# Patient Record
Sex: Female | Born: 1970 | Race: Black or African American | Hispanic: No | Marital: Married | Smoking: Never smoker
Health system: Southern US, Community
[De-identification: ages and names within clinical notes are randomized; demographics above are authoritative.]

## PROBLEM LIST (undated history)

## (undated) DIAGNOSIS — I10 Essential (primary) hypertension: Secondary | ICD-10-CM

## (undated) DIAGNOSIS — E049 Nontoxic goiter, unspecified: Secondary | ICD-10-CM

## (undated) HISTORY — PX: ABDOMINAL HYSTERECTOMY: SHX81

---

## 1999-07-17 ENCOUNTER — Encounter: Payer: Self-pay | Admitting: Internal Medicine

## 1999-07-17 ENCOUNTER — Ambulatory Visit (HOSPITAL_COMMUNITY): Admission: RE | Admit: 1999-07-17 | Discharge: 1999-07-17 | Payer: Self-pay | Admitting: Internal Medicine

## 2000-06-16 ENCOUNTER — Encounter: Payer: Self-pay | Admitting: Internal Medicine

## 2000-06-16 ENCOUNTER — Ambulatory Visit (HOSPITAL_COMMUNITY): Admission: RE | Admit: 2000-06-16 | Discharge: 2000-06-16 | Payer: Self-pay | Admitting: Internal Medicine

## 2001-07-03 ENCOUNTER — Inpatient Hospital Stay (HOSPITAL_COMMUNITY): Admission: AD | Admit: 2001-07-03 | Discharge: 2001-07-05 | Payer: Self-pay | Admitting: Obstetrics and Gynecology

## 2001-07-04 ENCOUNTER — Encounter: Payer: Self-pay | Admitting: Obstetrics and Gynecology

## 2001-08-03 ENCOUNTER — Inpatient Hospital Stay (HOSPITAL_COMMUNITY): Admission: AD | Admit: 2001-08-03 | Discharge: 2001-08-06 | Payer: Self-pay | Admitting: Obstetrics and Gynecology

## 2001-08-03 ENCOUNTER — Encounter (INDEPENDENT_AMBULATORY_CARE_PROVIDER_SITE_OTHER): Payer: Self-pay

## 2002-09-27 ENCOUNTER — Other Ambulatory Visit: Admission: RE | Admit: 2002-09-27 | Discharge: 2002-09-27 | Payer: Self-pay | Admitting: Obstetrics and Gynecology

## 2005-01-07 ENCOUNTER — Other Ambulatory Visit: Admission: RE | Admit: 2005-01-07 | Discharge: 2005-01-07 | Payer: Self-pay | Admitting: Obstetrics and Gynecology

## 2005-06-05 ENCOUNTER — Emergency Department (HOSPITAL_COMMUNITY): Admission: EM | Admit: 2005-06-05 | Discharge: 2005-06-06 | Payer: Self-pay | Admitting: Emergency Medicine

## 2006-07-08 ENCOUNTER — Ambulatory Visit (HOSPITAL_COMMUNITY): Admission: RE | Admit: 2006-07-08 | Discharge: 2006-07-08 | Payer: Self-pay | Admitting: Obstetrics and Gynecology

## 2006-12-20 ENCOUNTER — Ambulatory Visit (HOSPITAL_COMMUNITY): Admission: RE | Admit: 2006-12-20 | Discharge: 2006-12-20 | Payer: Self-pay | Admitting: Internal Medicine

## 2007-08-29 ENCOUNTER — Encounter: Admission: RE | Admit: 2007-08-29 | Discharge: 2007-08-29 | Payer: Self-pay | Admitting: Obstetrics and Gynecology

## 2007-10-02 ENCOUNTER — Encounter (INDEPENDENT_AMBULATORY_CARE_PROVIDER_SITE_OTHER): Payer: Self-pay | Admitting: Obstetrics and Gynecology

## 2007-10-02 ENCOUNTER — Inpatient Hospital Stay (HOSPITAL_COMMUNITY): Admission: RE | Admit: 2007-10-02 | Discharge: 2007-10-04 | Payer: Self-pay | Admitting: Obstetrics and Gynecology

## 2007-10-11 ENCOUNTER — Inpatient Hospital Stay (HOSPITAL_COMMUNITY): Admission: AD | Admit: 2007-10-11 | Discharge: 2007-10-11 | Payer: Self-pay | Admitting: Obstetrics and Gynecology

## 2010-01-19 ENCOUNTER — Emergency Department (HOSPITAL_COMMUNITY): Admission: EM | Admit: 2010-01-19 | Discharge: 2010-01-19 | Payer: Self-pay | Admitting: Emergency Medicine

## 2010-09-01 NOTE — Op Note (Signed)
NAMEJONIECE, Angela Crawford              ACCOUNT NO.:  [id number]   MEDICAL RECORD NO.:  [id number]          PATIENT TYPE:  INP   LOCATION:  9302                          FACILITY:  [facility]   PHYSICIAN:  Duke Salvia. Marcelle Overlie, M.D.DATE OF BIRTH:  08-Apr-1971   DATE OF PROCEDURE:  10/02/2007  DATE OF DISCHARGE:                               OPERATIVE REPORT   PREOPERATIVE DIAGNOSES:  Pelvic pain, symptomatic leiomyoma.   POSTOPERATIVE DIAGNOSES:  Pelvic pain, symptomatic leiomyoma plus  extensive pelvic adhesions.   PROCEDURE:  Diagnostic laparoscopy, total abdominal hysterectomy, and  lysis of adhesions.   SURGEON:  Duke Salvia. Marcelle Overlie, MD.   ASSISTANT:  Dineen Kid. Rana Snare, MD.   ANESTHESIA:  General endotracheal.   COMPLICATIONS:  None.   DRAINS:  Foley catheter.   BLOOD LOSS:  150.   SPECIMENS REMOVED:  Uterus.   PROCEDURE AND FINDINGS:  The patient was taken to the operating room  after an adequate level of general endotracheal anesthesia was obtained.  The patient's legs were in stirrups.  The abdomen, perineum, and vagina  were prepped and draped in usual manner for laparoscopy.  Bladder was  drained, EUA carried out, uterus was mid position, 8-10 weeks' size,  irregular.  Tenaculum was positioned.  Foley catheter positioned  draining clear urine.  The subumbilical area was infiltrated with 0.25%  Marcaine plain.  A small incision was made.  The Veress needle was  introduced without difficulty.  Its intra-abdominal position verified by  pressure and water testing.  After 2.5-3 L pneumoperitoneum was then  created, laparoscopic trocar and sleeve were then introduced without  difficulty.  There was no evidence of any bleeding or trauma.  The  patient was then placed in Trendelenburg.  There was extensive dense  adhesions from the anterior uterus completely obscuring the anterior  space.  Decision was made at that point to proceed with open surgery.   The patient's legs were placed  flat.  A Pfannenstiel incision was made  through her old C-section scar and carried down to the fascia which was  incised and extended transversely.  Rectus muscle was divided in the  midline.  Peritoneum entered superiorly without incident and extended  vertically.  There were some omental adhesions into the intra-abdominal  wall which were lysed in avascular plane.  O'Connor-O'Sullivan was  positioned.  The uterus was placed on traction and the dense anterior  adhesions were dissected with sharp and blunt dissection freeing up the  anterior space.  Starting on the left, the round ligaments were  coagulated and divided with LigaSure.  The utero-ovarian pedicle was  clamped, coagulated, and divided on each side conserving both ovaries.  The bladder flap area was dissected below with sharp and blunt  dissection, so that the bladder was well below the cervicovaginal  junction.  The uterine vasculature pedicles on either side was then  skeletonized, clamped, coagulated, and divided.  In sequential manner,  we started to close the uterus.  The uterine vasculature pedicle,  cardinal ligament were coagulated and divided.  The cervicovaginal  pedicles were clamped with curved Heaney clamps and  divided and sutured  in Heaney fashion.  The specimen was excised.  Cuff was closed with a  running 2-0 Monocryl suture.  The pelvis was irrigated with saline and  noted to be hemostatic.  At that point, the bladder was back-filled with  sterile milk.  There was no evidence of any bladder entry.  The pelvis  was re-irrigated and the operative site was noted to be hemostatic.  Prior to closure, sponge, needle, and instrument counts were correct x2.  Peritoneum was closed with running 2-0 Vicryl suture.  Fascia was closed  from laterally to midline on either side with a 0 PDS suture.  Prior to  closure of the fascia, a subfascial and subcutaneous On-Q catheter was  positioned.  Subcutaneous tissue was  irrigated and noted be hemostatic.  Clips and Steri-Strips with a pressure dressing were applied and the On-  Q catheters were primed.  She tolerated this well and went to recovery  room in good condition.      Richard M. Marcelle Overlie, M.D.  Electronically Signed     RMH/MEDQ  D:  10/02/2007  T:  10/02/2007  Job:  [id number]

## 2010-09-01 NOTE — Discharge Summary (Signed)
[name], [name]              ACCOUNT NO.:  [id number]   MEDICAL RECORD NO.:  [id number]          PATIENT TYPE:  INP   LOCATION:  9302                          FACILITY:  [facility]   PHYSICIAN:  Duke Salvia. Marcelle Overlie, M.D.DATE OF BIRTH:  22-Sep-1970   DATE OF ADMISSION:  10/02/2007  DATE OF DISCHARGE:  10/04/2007                               DISCHARGE SUMMARY   DISCHARGE DIAGNOSES:  1. Symptomatic leiomyoma.  2. Pelvic adhesions.  3. Diagnostic laparoscopy followed by total abdominal hysterectomy      this admission.   Summary of the history and physical exam, please see admission H&P for  details.  Briefly, this is a 40 year old G2, P2 patient with 1 previous  cesarean and tubal ligation with 6-12 months' of history of worsening  pelvic pain and documented leiomyoma who presents for hysterectomy.   HOSPITAL COURSE:  On October 02, 2007, under general anesthesia, the  patient underwent LAVH after a laparoscopy, however, there was severe  dense anterior adhesions to the uterus that necessitated laparotomy with  TAH.  Both ovaries were conserved.   PROCEDURE AND FINDINGS:  On the first postoperative day, her catheter  was removed.  She was afebrile.  Her diet was advanced.  The second  postop day, she was ambulating and established normal urinary function.  She was tolerating a regular diet, afebrile.  The On-Q catheters were  removed at that point and she was ready for discharge.   LABORATORY DATA:  UPT on admission was negative.  CMET normal except for  glucose of 102.  CBC on admission, WBC 5.8, hemoglobin 12.2, hematocrit  35.6, blood type O positive.  Antibody screen negative.  CBC on October 03, 2007, WBC 11.3, hemoglobin 9.1, and hematocrit 26.4.   DISPOSITION:  The patient discharged on Tylox p.r.n. pain and will take  multivitamin with iron once daily.  Will to return the office in 4-5  days and have the staples removed.  Advised to report any incisional  redness or drainage,  increase pain or bleeding, or fever over 101.  She  was given specific instructions for regarding, sex, and exercise.   CONDITION:  Good.   ACTIVITIES:  Gradual increase.      Richard M. Marcelle Overlie, M.D.  Electronically Signed     RMH/MEDQ  D:  10/04/2007  T:  10/05/2007  Job:  [id number]

## 2010-09-01 NOTE — H&P (Signed)
Angela Crawford, Angela Crawford              ACCOUNT NO.:  [id number]   MEDICAL RECORD NO.:  [id number]          PATIENT TYPE:  AMB   LOCATION:                                [facility]  [facility]   PHYSICIAN:  Duke Salvia. Marcelle Overlie, M.D.DATE OF BIRTH:  1970-04-27   DATE OF ADMISSION:  10/02/2007  DATE OF DISCHARGE:                              HISTORY & PHYSICAL   CHIEF COMPLAINT:  Symptomatic fibroids.   HISTORY OF PRESENT ILLNESS:  A 40 year old G2, P2 previous tubal.  She  has had one vaginal delivery and cesarean section with tubal at the same  time, has a history of last several years of worsening pelvic pain that  has really increased over the last 6 months.  Part of her evaluation,  she has recently had a ultrasound April 2009, that showed several  fibroids 4.1, 3.6, 3.2 and 1.8 cm.   Approximately a year ago, she had abdominal pelvic CT that did show  small fibroids.  No other abnormalities except for a small liver  hemangioma.  This was evaluated by GI and they also performed  colonoscopy that was normal except for mild colitis that was April 2008.  She presents now for LAVH.  This procedure including risks related to  bleeding, infection, adjacent organ injury, the possible need for open  additional surgery were all reviewed.  Other risks related to wound  infection, phlebitis and her expected recovery time all reviewed which  she understands and accepts.   PAST MEDICAL HISTORY:  Allergies, none.   CURRENT MEDICATIONS:  Lisinopril.   OPERATIVE HISTORY:  She has had one C-section.   FAMILY HISTORY:  Significant for a sister who had a kidney transplant,  mother and grandmother both with hypertension.   PHYSICAL EXAMINATION:  Temperature 93, blood pressure 112/84.  HEENT:  Unremarkable.  NECK:  Supple without masses.  LUNGS:  Clear.  CARDIOVASCULAR:  Regular rate and rhythm without murmurs, rubs or  gallops.  BREASTS:  Without masses.  ABDOMEN:  Soft, flat and nontender.  PELVIC:   Normal external genitalia.  Vagina and cervix clear.  Uterus, 8-  week size, mid position.  Adnexa negative.  EXTREMITIES/NEUROLOGIC:  Unremarkable   IMPRESSION:  Symptomatic leiomyoma.   PLAN:  LAVH procedure and risks reviewed as above.      Richard M. Marcelle Overlie, M.D.  Electronically Signed     RMH/MEDQ  D:  09/27/2007  T:  09/27/2007  Job:  [id number]

## 2010-09-02 ENCOUNTER — Other Ambulatory Visit: Payer: Self-pay | Admitting: Internal Medicine

## 2010-09-02 DIAGNOSIS — R221 Localized swelling, mass and lump, neck: Secondary | ICD-10-CM

## 2010-09-04 NOTE — H&P (Signed)
[facility]  Patient:    Angela Crawford, Angela Crawford Visit Number: [id number] MRN: [id number]          Service Type: OBS Location: 910B 9153 01 Attending Physician:  Rhina Brackett Dictated by:   Duke Salvia. Marcelle Overlie, M.D. Admit Date:  07/03/2001 Discharge Date: 07/05/2001                           History and Physical  CHIEF COMPLAINT:              Breech presentation at term.  HISTORY OF PRESENT ILLNESS:   A 40 year old G2 P1, EDD August 12, 2001 with EGA [redacted] weeks.  Presents for a primary cesarean section for persistent breech presentation.  She declined ECV.  This procedure including risks of bleeding, infection, possible need for transfusion discussed.  The permanence of the tubal procedure, failure rate of 2-06/998 reviewed with her also.  LABORATORY DATA:              Blood type O positive.  Rubella titer positive. One-hour GTT 99.  PAST MEDICAL HISTORY:  ALLERGIES:                    None.  OPERATIONS:                   Vaginal delivery in 1994.  Wisdom teeth extraction.  MEDICAL:                      She has a history of hypothyroidism, is currently on Synthroid replacement.  Rubella titer positive.  Varicella IgG and IgM are negative.  PHYSICAL EXAMINATION:  VITAL SIGNS:                  Temperature 98.2, blood pressure 120/68.  No proteinuria noted on April 14.  LUNGS:                        Clear.  BREASTS:                      Not examined.  ABDOMEN:                      Term fundal height.  Fetal heart rate 140.  CERVIX:                       Closed, breech by Leopolds and also by bedside US.  IMPRESSION:                   1. Term intrauterine pregnancy.                               2. Mild chronic hypertension, stable.                               3. Persistent breech presentation.  PLAN:                         Primary cesarean section and tubal ligation. Procedure and risks discussed as above. Dictated by:   Duke Salvia. Marcelle Overlie, M.D. Attending Physician:  Rhina Brackett DD:  07/31/01 TD:  07/31/01 Job: [id number]

## 2010-09-04 NOTE — Op Note (Signed)
[facility]  Patient:    Angela Crawford, Angela Crawford Visit Number: [id number] MRN: [id number]          Service Type: OBS Location: 9300 [id number] [id number] Attending Physician:  Rhina Brackett Dictated by:   Duke Salvia. Marcelle Overlie, M.D. Proc. Date: 08/03/01 Admit Date:  08/03/2001                             Operative Report  PREOPERATIVE DIAGNOSIS:       Term intrauterine pregnancy, persistent breech                               presentation, declined _____, request permanent                               sterilization.  POSTOPERATIVE DIAGNOSIS:      Term intrauterine pregnancy, persistent breech                               presentation, declined _____, request permanent                               sterilization  OPERATION:                    Primary low transverse cesarean section,                               modified Pomeroy tubal ligation.  SURGEON:                      Duke Salvia. Marcelle Overlie, M.D.  ANESTHESIA:                   Spinal  COMPLICATIONS:                None.  DRAINS:                       Foley catheter.  ESTIMATED BLOOD LOSS:         800.  DESCRIPTION OF PROCEDURE:     The patient was brought to the operating room and after an adequate level of spinal anesthetic was obtained, the patient in the left tilt position, the abdomen prepped and draped in the usual manner for sterile abdominal procedures, a transverse Pfannenstiel incision was made two fingerbreadths above the symphysis, and carried down to the fascia which was incised and extended transversely.  The rectus muscles were divided in the midline.  The peritoneum was entered superiorly without incident and extended in a vertical manner.  The vesicouterine serosa was incised and the bladder was bluntly and sharply dissected off of the lower uterine segment and the bladder blade repositioned.  A transverse incision was made in the lower segment and extended with bandaged scissors.   Clear fluid noted.  The patient was then delivered of a female in the frank breech presentation without difficulty.  Apgar was 9 and 9.  There was a nuchal cord x1 and cord around the body x1.  After delivery of the infant, the placenta was delivered manually intact.  The uterus was exteriorized and the cavity  wiped clean with a laparotomy pack. Closure was obtained in the first layer with 0 chromic in a locked fashion followed by imbricating layer of 0 chromic.  This was hemostatic.  Attention was directed to the tubal.  Starting on the right, the tube was grasped in the mid segment and 0 plain suture tied around each end of a mid segment knuckle of tube which was excised and the cut ends cauterized with the Bovie, and 2-0 silk suture placed around the proximal segment.  The tubes and ovaries were normal on either side.  The tip of the appendix was adherent with a solitary adhesion to what appeared to be a small fibroid at the fundus of the uterus.  This was clamped and an avascular plane and released.  The appendix was not removed.  It was taken off tension.  The uterus was returned to its intra-abdominal position.  Prior to closure, the sponge, needle, and instrument counts were reported as correct x2.  The uterine incision site was hemostatic.  The rectus muscles were reapproximated in the midline with 2-0 Dexon interrupted sutures.  The fascia was closed from laterally to midline on either side with a 0 Dexon running suture.  The subcutaneous fat was hemostatic.  Clips and Steri-Strips were used on the skin.  She tolerated this well and went to the recovery room in good condition. Dictated by:   Duke Salvia. Marcelle Overlie, M.D. Attending Physician:  Rhina Brackett DD:  08/03/01 TD:  08/04/01 Job: [id number]

## 2010-09-04 NOTE — Discharge Summary (Signed)
[facility]  Patient:    Angela Crawford, Angela Crawford Visit Number: [id number] MRN: [id number]          Service Type: OBS Location: 9300 [id number] 01 Attending Physician:  Rhina Brackett Dictated by:   Julio Sicks, N.P. Admit Date:  08/03/2001 Discharge Date: 08/06/2001                             Discharge Summary  ADMITTING DIAGNOSES:          1. Intrauterine pregnancy at term.                               2. Breech presentation, declined external                                  version.                               3. Request permanent sterilization.  DISCHARGE DIAGNOSES:          1. Primary low transverse cesarean delivery with                                  a viable female infant.                               2. Pomeroy tubal ligation.  PROCEDURE:                    Primary low transverse cesarean delivery with modified Pomeroy tubal ligation.  REASON FOR ADMISSION:         Please see dictated H&P.  HOSPITAL COURSE:              The patient was taken to the operating room for the above-named procedure.  A Pfannenstiel incision was made and a viable female infant was delivered without complication weighing 7 pounds 4 ounces with Apgars of 9 at 1 minute and 9 at 5 minutes.  On postoperative day #1, the patient had good return of bowel function.  The fundus was firm, lochia small. Abdominal dressing was clean, dry, and intact.  Foley was removed and the patient was voiding without difficulty.  On postoperative day #2, the abdomen remained soft.  The incision was clean, dry, and intact.  Labs revealed hemoglobin of 8.5, hematocrit of 24.7, and WBC count of 11,000.  On postoperative day #3, the patient was doing well, ambulating without assistance.  Staples were removed and the patient was discharged home.  CONDITION ON DISCHARGE:       Good.  DIET:                         Regular as tolerated.  ACTIVITY:                     No heavy lifting, no  driving x2 weeks, no vaginal entry.  FOLLOWUP:                     The patient is to follow up in the office in 1-2 weeks for  an incision check.  She is to call for a temperature greater than 100 degrees, persistent nausea and vomiting, heavy vaginal bleeding, and/or redness or drainage from the incision site.  DISCHARGE MEDICATIONS:        1. Tylox 1 every 4-6 hours p.r.n. pain.                               2. Motrin 600 mg over-the-counter every six                                  hours p.r.n.                               3. Prenatal vitamins 1 p.o. daily.                               4. Resume Synthroid 0.112 daily. Dictated by:   Julio Sicks, N.P. Attending Physician:  Rhina Brackett DD:  08/24/01 TD:  08/28/01 Job: [id number]

## 2010-09-04 NOTE — H&P (Signed)
[facility]  Patient:    Angela Crawford, Angela Crawford Visit Number: [id number] MRN: [id number]          Service Type: OBS Location: 910B 9153 01 Attending Physician:  Rhina Brackett Dictated by:   Duke Salvia. Marcelle Overlie, M.D. Admit Date:  07/03/2001 Discharge Date: 07/05/2001                           History and Physical  DATE OF SCHEDULED SURGERY:    August 08, 2001  CHIEF COMPLAINT:              Chronic pelvic pain.  HISTORY OF PRESENT ILLNESS:   This 40 year old G0, P0, currently on Ortho Tri-Cyclen and has had a several-year history of midpelvic and right lower quadrant pelvic pain.  This did not improve with conservative therapy, and she [facility] a diagnostic lapar[facility], 1999[facility]  The findings at that time showed that the uterine fundus was deviated sharply to the right.  The right tube and ovary appeared to be normal, but the right broad ligament was extremely foreshortened, pulling the entire uterus to the right.  This did not appear to be related to endometriosis or chronic inflammatory disease, but looked perhaps more congenital.  She has continued to struggle with episodes of pain on the right side that is not improved with nonsteroidal medicines, and has made the decision to proceed with a definite hysterectomy and RSO.  This procedure, including risk of bleeding, infection, transfusion, adjacent organ injury, with the possible need for open surgery to complete the case.  All has been discussed with her.  Also of note, two years ago she had a GI evaluation by Dr. Anselmo Rod, with a negative GI evaluation.  ALLERGIES:                    No known drug allergies.  OPERATIONS:                   Laparoscopy and a spinal fusion at L4-5 in 1988.  CURRENT MEDICATIONS:          OCP.  PAST MEDICAL HISTORY:         She has had one prior SAB.  PHYSICAL EXAMINATION  VITAL SIGNS:                   Temperature 98.2 degrees, blood pressure 120/68.  HEENT:                        Unremarkable.  NECK:                         Supple without masses.  LUNGS:                        Clear.  CARDIOVASCULAR:               Regular rate and rhythm without murmurs, rubs, or gallops.  BREASTS:                      Without masses.  ABDOMEN:                      Soft, flat, nontender.  PELVIC:  Normal external genitalia.  Vagina, cervix clear.  Uterus deviated to the right, slightly tender.  Adnexa without masses or tenderness.  IMPRESSION:                   Chronic pelvic pain, with laparoscopic findings showing a shortened right broad and round ligament.  Uterus strongly deviated to the right side.  PLAN:                         LAVH, RSO.  The procedure and risks were reviewed as above. Dictated by:   Duke Salvia. Marcelle Overlie, M.D. Attending Physician:  Rhina Brackett DD:  07/31/01 TD:  07/31/01 Job: [id number]

## 2010-09-09 ENCOUNTER — Ambulatory Visit (HOSPITAL_COMMUNITY)
Admission: RE | Admit: 2010-09-09 | Discharge: 2010-09-09 | Disposition: A | Discharge status: Home / Self Care | Payer: Medicare HMO | Source: Ambulatory Visit | Attending: Internal Medicine | Admitting: Internal Medicine

## 2010-09-09 DIAGNOSIS — E049 Nontoxic goiter, unspecified: Secondary | ICD-10-CM | POA: Insufficient documentation

## 2010-09-09 DIAGNOSIS — R221 Localized swelling, mass and lump, neck: Secondary | ICD-10-CM

## 2010-10-19 ENCOUNTER — Encounter: Payer: Self-pay | Admitting: *Deleted

## 2011-01-14 LAB — TYPE AND SCREEN
ABO/RH(D): O POS
Antibody Screen: NEGATIVE

## 2011-01-14 LAB — CBC
HCT: 35.6 — ABNORMAL LOW
HCT: 26.4 — ABNORMAL LOW
Hemoglobin: 10.6 — ABNORMAL LOW
MCHC: 34.3
MCV: 84.7
MCV: 85.6
Platelets: 467 — ABNORMAL HIGH
RBC: 4.2
RBC: 3.09 — ABNORMAL LOW
RBC: 3.69 — ABNORMAL LOW
RDW: 15.1
WBC: 5.8
WBC: 10.8 — ABNORMAL HIGH

## 2011-01-14 LAB — COMPREHENSIVE METABOLIC PANEL
ALT: 20
AST: 16
Alkaline Phosphatase: 67
BUN: 8
Chloride: 99
GFR calc Af Amer: >60
Potassium: 3.6
Total Protein: 7.5

## 2011-01-14 LAB — PREGNANCY, URINE: Preg Test, Ur: NEGATIVE

## 2011-01-14 LAB — ABO/RH: ABO/RH(D): O POS

## 2011-12-20 ENCOUNTER — Encounter (HOSPITAL_COMMUNITY): Payer: Self-pay

## 2011-12-20 ENCOUNTER — Emergency Department (HOSPITAL_COMMUNITY)
Admission: EM | Admit: 2011-12-20 | Discharge: 2011-12-20 | Disposition: A | Discharge status: Home / Self Care | Payer: Managed Care, Other (non HMO) | Source: Home / Self Care | Attending: Emergency Medicine | Admitting: Emergency Medicine

## 2011-12-20 DIAGNOSIS — R1032 Left lower quadrant pain: Secondary | ICD-10-CM

## 2011-12-20 DIAGNOSIS — R3129 Other microscopic hematuria: Secondary | ICD-10-CM

## 2011-12-20 DIAGNOSIS — R52 Pain, unspecified: Secondary | ICD-10-CM

## 2011-12-20 HISTORY — DX: Essential (primary) hypertension: I10

## 2011-12-20 LAB — POCT URINALYSIS DIP (DEVICE)
Bilirubin Urine: NEGATIVE
Ketones, ur: NEGATIVE mg/dL
Leukocytes, UA: NEGATIVE
Urobilinogen, UA: 0.2 mg/dL (ref 0.0–1.0)

## 2011-12-20 MED ORDER — HYDROCODONE-ACETAMINOPHEN 7.5-325 MG PO TABS: 1.0000 | ORAL_TABLET | ORAL | Status: AC | PRN

## 2011-12-20 MED ORDER — KETOROLAC TROMETHAMINE 60 MG/2ML IM SOLN: 60.0000 mg | Freq: Once | INTRAMUSCULAR | Status: DC

## 2011-12-20 MED ORDER — KETOROLAC TROMETHAMINE 60 MG/2ML IM SOLN
60.0000 mg | Freq: Once | INTRAMUSCULAR | Status: AC
  Administered 2011-12-20: 60 mg via INTRAMUSCULAR

## 2011-12-20 MED ORDER — KETOROLAC TROMETHAMINE 60 MG/2ML IM SOLN
INTRAMUSCULAR | Status: AC
  Filled 2011-12-20: qty 2

## 2011-12-20 NOTE — ED Provider Notes (Signed)
Medical screening examination/treatment/procedure(s) were performed by non-physician practitioner and as supervising physician I was immediately available for consultation/collaboration.  Luiz Blare MD   Luiz Blare, MD 12/20/11 [phone]

## 2011-12-20 NOTE — ED Notes (Signed)
Provided w urine strainer and  instructed in use

## 2011-12-20 NOTE — ED Provider Notes (Addendum)
History     CSN: [id number]  Arrival date & time 12/20/11  1737   First MD Initiated Contact with Patient 12/20/11 1816      Chief Complaint  Patient presents with  . Abdominal Pain    (Consider location/radiation/quality/duration/timing/severity/associated sxs/prior treatment) HPI Comments: Pain located L lower most quadrant 1 cm superior to the iliac crest and radiating to the L hip.   Patient is a 41 y.o. female presenting with abdominal pain.  Abdominal Pain The primary symptoms of the illness include abdominal pain. The primary symptoms of the illness do not include fever, shortness of breath, nausea, vomiting, diarrhea, hematemesis, dysuria, vaginal discharge or vaginal bleeding. The current episode started more than 2 days ago. The onset of the illness was gradual. The problem has been gradually worsening.  The patient states that she believes she is currently not pregnant. The patient has not had a change in bowel habit. Additional symptoms associated with the illness include frequency and back pain. Symptoms associated with the illness do not include chills, diaphoresis, heartburn or constipation. Significant associated medical issues do not include inflammatory bowel disease, liver disease, diverticulitis or cardiac disease.    Past Medical History  Diagnosis Date  . Hypertension     Past Surgical History  Procedure Date  . Abdominal hysterectomy     History reviewed. No pertinent family history.  History  Substance Use Topics  . Smoking status: Never Smoker   . Smokeless tobacco: Not on file  . Alcohol Use: Yes    OB History    Grav Para Term Preterm Abortions TAB SAB Ect Mult Living                  Review of Systems  Constitutional: Negative for fever, chills and diaphoresis.  Respiratory: Negative for shortness of breath.   Gastrointestinal: Positive for abdominal pain. Negative for heartburn, nausea, vomiting, diarrhea, constipation and hematemesis.    Genitourinary: Positive for frequency. Negative for dysuria, vaginal bleeding and vaginal discharge.  Musculoskeletal: Positive for back pain.    Allergies  Review of patient's allergies indicates no known allergies.  Home Medications   Current Outpatient Rx  Name Route Sig Dispense Refill  . HYDROCODONE-ACETAMINOPHEN 7.5-325 MG PO TABS Oral Take 1 tablet by mouth every 4 (four) hours as needed for pain. 30 tablet 0  . LEVOTHYROXINE SODIUM 88 MCG PO TABS Oral Take 88 mcg by mouth daily.      Marland Kitchen ONE-DAILY MULTI VITAMINS PO TABS Oral Take 1 tablet by mouth daily.      Marland Kitchen OLMESARTAN MEDOXOMIL-HCTZ 40-12.5 MG PO TABS Oral Take 1 tablet by mouth daily.        BP 130/83  Pulse 94  Temp 99.6 F (37.6 C) (Oral)  Resp 19  SpO2 99%  Physical Exam  Nursing note and vitals reviewed. Constitutional: She is oriented to person, place, and time. She appears well-developed and well-nourished. No distress.  Neck: Normal range of motion. Neck supple.  Cardiovascular: Normal rate, regular rhythm and normal heart sounds.   Pulmonary/Chest: Effort normal and breath sounds normal. No respiratory distress.  Abdominal: Soft. She exhibits no mass. There is tenderness. There is no rebound and no guarding.       Hypoactive BS. No lateral abd tenderness. Tenderness 1 cm superior to iliac crest. There is also incidental mild tenderness in RUQ.   Neurological: She is alert and oriented to person, place, and time.  Skin: Skin is warm and dry.  ED Course  Procedures (including critical care time)  Labs Reviewed  POCT URINALYSIS DIP (DEVICE) - Abnormal; Notable for the following:    Hgb urine dipstick MODERATE (*)     All other components within normal limits   No results found.   1. Abdominal pain, acute, left lower quadrant   2. Hematuria, microscopic       MDM  With micro hematuria type of pain as colicy/intermittent and absence of acute abdomen signs I believe the most likely diagnosis is  urteral stone. Doubt diverticulitis at this time.  Treat with Norco for pain, plenty of fluids and to f/u with Dr. Laverle Patter this week. If worse or new sx's in the meantime to go to the ED.   Results for orders placed during the hospital encounter of 12/20/11  POCT URINALYSIS DIP (DEVICE)      Component Value Range   Glucose, UA NEGATIVE  NEGATIVE mg/dL   Bilirubin Urine NEGATIVE  NEGATIVE   Ketones, ur NEGATIVE  NEGATIVE mg/dL   Specific Gravity, Urine 1.015  1.005 - 1.030   Hgb urine dipstick MODERATE (*) NEGATIVE   pH 5.5  5.0 - 8.0   Protein, ur NEGATIVE  NEGATIVE mg/dL   Urobilinogen, UA 0.2  0.0 - 1.0 mg/dL   Nitrite NEGATIVE  NEGATIVE   Leukocytes, UA NEGATIVE  NEGATIVE        Hayden Rasmussen, NP 12/20/11 2055  Hayden Rasmussen, NP 12/20/11 2308

## 2011-12-20 NOTE — ED Notes (Signed)
C/o LLQ abdominal pain w radiation to lower left back/hip since PM 9-29. Had 1 prior episode that lasted only 1 day. Had 2 BM's today , first one loose, 2nd one "normal"; NAD at present, but looks uncomfortable. States when she urinates, she has the sensation she is done, but needs to go again.C/o slight nausea, no vomiting

## 2011-12-22 NOTE — ED Provider Notes (Signed)
Chart reviewed. Agree with Hayden Rasmussen, NP.  Cristal Ford, MD 12/22/2011, 8:42 AM

## 2012-12-13 ENCOUNTER — Emergency Department (HOSPITAL_COMMUNITY)
Admission: EM | Admit: 2012-12-13 | Discharge: 2012-12-13 | Disposition: A | Discharge status: Home / Self Care | Payer: Managed Care, Other (non HMO) | Source: Home / Self Care | Attending: Emergency Medicine | Admitting: Emergency Medicine

## 2012-12-13 ENCOUNTER — Encounter (HOSPITAL_COMMUNITY): Payer: Self-pay | Admitting: Emergency Medicine

## 2012-12-13 DIAGNOSIS — D649 Anemia, unspecified: Secondary | ICD-10-CM

## 2012-12-13 DIAGNOSIS — M79609 Pain in unspecified limb: Secondary | ICD-10-CM

## 2012-12-13 DIAGNOSIS — M79604 Pain in right leg: Secondary | ICD-10-CM

## 2012-12-13 DIAGNOSIS — R600 Localized edema: Secondary | ICD-10-CM

## 2012-12-13 DIAGNOSIS — R609 Edema, unspecified: Secondary | ICD-10-CM

## 2012-12-13 LAB — POCT I-STAT, CHEM 8
Calcium, Ion: 1.39 mmol/L — ABNORMAL HIGH (ref 1.12–1.23)
Chloride: 104 mEq/L (ref 96–112)
Glucose, Bld: 100 mg/dL — ABNORMAL HIGH (ref 70–99)
HCT: 34 % — ABNORMAL LOW (ref 36.0–46.0)
Hemoglobin: 11.6 g/dL — ABNORMAL LOW (ref 12.0–15.0)
TCO2: 26 mmol/L (ref 0–100)

## 2012-12-13 LAB — D-DIMER, QUANTITATIVE: D-Dimer, Quant: 1.45 ug/mL-FEU — ABNORMAL HIGH (ref 0.00–0.48)

## 2012-12-13 MED ORDER — TRAMADOL HCL 50 MG PO TABS: 50.0000 mg | ORAL_TABLET | Freq: Four times a day (QID) | ORAL | Status: DC | PRN

## 2012-12-13 MED ORDER — FUROSEMIDE 40 MG PO TABS: 40.0000 mg | ORAL_TABLET | Freq: Every morning | ORAL | Status: AC
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
## 2012-12-14 ENCOUNTER — Ambulatory Visit (HOSPITAL_COMMUNITY)
Admission: RE | Admit: 2012-12-14 | Discharge: 2012-12-14 | Disposition: A | Discharge status: Home / Self Care | Payer: Managed Care, Other (non HMO) | Source: Ambulatory Visit | Attending: Emergency Medicine | Admitting: Emergency Medicine

## 2012-12-14 ENCOUNTER — Encounter (HOSPITAL_COMMUNITY): Payer: Self-pay | Admitting: *Deleted

## 2012-12-14 DIAGNOSIS — M79609 Pain in unspecified limb: Secondary | ICD-10-CM

## 2012-12-14 DIAGNOSIS — M7989 Other specified soft tissue disorders: Secondary | ICD-10-CM | POA: Insufficient documentation

## 2012-12-14 NOTE — ED Notes (Signed)
D-dimer 1.45 H, BNP 296.1 H.  I can't see any notes. Labs shown to Dr. Denyse Amass.  He said pt. had a vascular study today and Almedia Balls PA has talked to the pt. Vassie Moselle 12/14/2012

## 2012-12-14 NOTE — Progress Notes (Signed)
VASCULAR LAB PRELIMINARY  PRELIMINARY  PRELIMINARY  PRELIMINARY  Bilateral lower extremity venous duplex completed.    Preliminary report:  Bilateral:  No evidence of DVT, superficial thrombosis, or Baker's Cyst.   Desirai Traxler, RVS 12/14/2012, 9:52 AM

## 2012-12-15 NOTE — ED Provider Notes (Signed)
Medical screening examination/treatment/procedure(s) were performed by non-physician practitioner and as supervising physician I was immediately available for consultation/collaboration.  Raynald Blend, MD 12/15/12 [phone]

## 2012-12-27 ENCOUNTER — Other Ambulatory Visit: Payer: Self-pay | Admitting: Internal Medicine

## 2012-12-27 ENCOUNTER — Other Ambulatory Visit (HOSPITAL_COMMUNITY): Payer: Self-pay | Admitting: Internal Medicine

## 2012-12-27 ENCOUNTER — Ambulatory Visit (HOSPITAL_COMMUNITY)
Admission: RE | Admit: 2012-12-27 | Discharge: 2012-12-27 | Disposition: A | Discharge status: Home / Self Care | Payer: Managed Care, Other (non HMO) | Source: Ambulatory Visit | Attending: Internal Medicine | Admitting: Internal Medicine

## 2012-12-27 DIAGNOSIS — R0602 Shortness of breath: Secondary | ICD-10-CM | POA: Insufficient documentation

## 2012-12-27 DIAGNOSIS — R0989 Other specified symptoms and signs involving the circulatory and respiratory systems: Secondary | ICD-10-CM | POA: Insufficient documentation

## 2012-12-27 DIAGNOSIS — R0609 Other forms of dyspnea: Secondary | ICD-10-CM | POA: Insufficient documentation

## 2012-12-27 DIAGNOSIS — R0600 Dyspnea, unspecified: Secondary | ICD-10-CM

## 2012-12-27 DIAGNOSIS — I080 Rheumatic disorders of both mitral and aortic valves: Secondary | ICD-10-CM | POA: Insufficient documentation

## 2012-12-27 DIAGNOSIS — I059 Rheumatic mitral valve disease, unspecified: Secondary | ICD-10-CM

## 2012-12-27 DIAGNOSIS — I079 Rheumatic tricuspid valve disease, unspecified: Secondary | ICD-10-CM | POA: Insufficient documentation

## 2012-12-27 DIAGNOSIS — I10 Essential (primary) hypertension: Secondary | ICD-10-CM | POA: Insufficient documentation

## 2012-12-27 NOTE — Progress Notes (Signed)
Echocardiogram 2D Echocardiogram has been performed.  Angela Crawford 12/27/2012, 3:51 PM

## 2013-02-07 ENCOUNTER — Emergency Department (INDEPENDENT_AMBULATORY_CARE_PROVIDER_SITE_OTHER): Payer: Managed Care, Other (non HMO)

## 2013-02-07 ENCOUNTER — Emergency Department (HOSPITAL_COMMUNITY)
Admission: EM | Admit: 2013-02-07 | Discharge: 2013-02-07 | Disposition: A | Discharge status: Home / Self Care | Payer: Managed Care, Other (non HMO) | Source: Home / Self Care

## 2013-02-07 ENCOUNTER — Encounter (HOSPITAL_COMMUNITY): Payer: Self-pay | Admitting: Emergency Medicine

## 2013-02-07 DIAGNOSIS — R0981 Nasal congestion: Secondary | ICD-10-CM

## 2013-02-07 DIAGNOSIS — J3489 Other specified disorders of nose and nasal sinuses: Secondary | ICD-10-CM

## 2013-02-07 MED ORDER — FEXOFENADINE HCL 180 MG PO TABS: 180.0000 mg | ORAL_TABLET | Freq: Every day | ORAL | Status: AC

## 2013-02-07 MED ORDER — FLUTICASONE PROPIONATE 50 MCG/ACT NA SUSP: 2.0000 | Freq: Two times a day (BID) | NASAL | Status: AC

## 2013-02-07 MED ORDER — METHYLPREDNISOLONE ACETATE 40 MG/ML IJ SUSP
80.0000 mg | Freq: Once | INTRAMUSCULAR | Status: AC
  Administered 2013-02-07: 80 mg via INTRAMUSCULAR

## 2013-02-07 MED ORDER — METHYLPREDNISOLONE ACETATE 80 MG/ML IJ SUSP
INTRAMUSCULAR | Status: AC
  Filled 2013-02-07: qty 1

## 2013-02-07 NOTE — ED Notes (Signed)
Concern for poss sinus infection. C/o pain in left side of face, eye watering, left ear pain, pain worse when bends over

## 2013-02-07 NOTE — ED Provider Notes (Signed)
CSN: [id number]     Arrival date & time 02/07/13  1409 History   None    Chief Complaint  Patient presents with  . Facial Pain   (Consider location/radiation/quality/duration/timing/severity/associated sxs/prior Treatment) Patient is a 42 y.o. female presenting with URI. The history is provided by the patient.  URI Presenting symptoms: congestion, facial pain and rhinorrhea   Presenting symptoms: no fever   Severity:  Moderate Onset quality:  Gradual Duration:  1 week Progression:  Unchanged Chronicity:  New Relieved by:  None tried Associated symptoms: sinus pain     Past Medical History  Diagnosis Date  . Hypertension    Past Surgical History  Procedure Laterality Date  . Abdominal hysterectomy     History reviewed. No pertinent family history. History  Substance Use Topics  . Smoking status: Never Smoker   . Smokeless tobacco: Not on file  . Alcohol Use: Yes   OB History   Grav Para Term Preterm Abortions TAB SAB Ect Mult Living                 Review of Systems  Constitutional: Negative.  Negative for fever.  HENT: Positive for congestion, rhinorrhea and sinus pressure.   Eyes: Positive for discharge.  Respiratory: Negative.   Gastrointestinal: Negative.     Allergies  Review of patient's allergies indicates no known allergies.  Home Medications   Current Outpatient Rx  Name  Route  Sig  Dispense  Refill  . furosemide (LASIX) 40 MG tablet   Oral   Take 1 tablet (40 mg total) by mouth every morning.   30 tablet   0   . levothyroxine (SYNTHROID, LEVOTHROID) 88 MCG tablet   Oral   Take 88 mcg by mouth daily.           . Multiple Vitamin (MULTIVITAMIN) tablet   Oral   Take 1 tablet by mouth daily.           Marland Kitchen olmesartan-hydrochlorothiazide (BENICAR HCT) 40-12.5 MG per tablet   Oral   Take 1 tablet by mouth daily.           . traMADol (ULTRAM) 50 MG tablet   Oral   Take 1 tablet (50 mg total) by mouth every 6 (six) hours as needed for  pain.   30 tablet   0    BP 127/72  Pulse 103  Temp(Src) 98.7 F (37.1 C) (Oral)  Resp 20  SpO2 100% Physical Exam  Nursing note and vitals reviewed. Constitutional: She is oriented to person, place, and time. She appears well-developed and well-nourished.  HENT:  Head: Normocephalic.  Right Ear: External ear normal.  Left Ear: External ear normal.  Nose: Mucosal edema and rhinorrhea present. No sinus tenderness. Right sinus exhibits no maxillary sinus tenderness and no frontal sinus tenderness. Left sinus exhibits maxillary sinus tenderness and frontal sinus tenderness.  Mouth/Throat: Oropharynx is clear and moist.  Eyes: Conjunctivae are normal. Pupils are equal, round, and reactive to light.  Neck: Normal range of motion. Neck supple. Thyromegaly present.  Lymphadenopathy:    She has no cervical adenopathy.  Neurological: She is alert and oriented to person, place, and time.  Skin: Skin is warm and dry.    ED Course  Procedures (including critical care time) Labs Review Labs Reviewed - No data to display Imaging Review No results found.    MDM  X-rays reviewed and report per radiologist.     Linna Hoff, MD 02/07/13 [phone]

## 2013-02-15 ENCOUNTER — Other Ambulatory Visit: Payer: Self-pay | Admitting: Internal Medicine

## 2013-02-15 DIAGNOSIS — E049 Nontoxic goiter, unspecified: Secondary | ICD-10-CM

## 2013-02-15 DIAGNOSIS — R7989 Other specified abnormal findings of blood chemistry: Secondary | ICD-10-CM

## 2013-02-27 ENCOUNTER — Ambulatory Visit (HOSPITAL_COMMUNITY): Payer: Managed Care, Other (non HMO)

## 2013-02-28 ENCOUNTER — Other Ambulatory Visit (HOSPITAL_COMMUNITY): Payer: Managed Care, Other (non HMO)

## 2013-03-01 ENCOUNTER — Encounter (HOSPITAL_COMMUNITY)
Admission: RE | Admit: 2013-03-01 | Discharge: 2013-03-01 | Disposition: A | Discharge status: Home / Self Care | Payer: Managed Care, Other (non HMO) | Source: Ambulatory Visit | Attending: Internal Medicine | Admitting: Internal Medicine

## 2013-03-01 DIAGNOSIS — E049 Nontoxic goiter, unspecified: Secondary | ICD-10-CM | POA: Insufficient documentation

## 2013-03-01 DIAGNOSIS — E0590 Thyrotoxicosis, unspecified without thyrotoxic crisis or storm: Secondary | ICD-10-CM | POA: Insufficient documentation

## 2013-03-01 DIAGNOSIS — R7989 Other specified abnormal findings of blood chemistry: Secondary | ICD-10-CM

## 2013-03-02 ENCOUNTER — Encounter (HOSPITAL_COMMUNITY)
Admission: RE | Admit: 2013-03-02 | Discharge: 2013-03-02 | Disposition: A | Discharge status: Home / Self Care | Payer: Managed Care, Other (non HMO) | Source: Ambulatory Visit | Attending: Internal Medicine | Admitting: Internal Medicine

## 2013-03-02 ENCOUNTER — Encounter (HOSPITAL_COMMUNITY): Payer: Self-pay

## 2013-03-02 HISTORY — DX: Nontoxic goiter, unspecified: E04.9

## 2013-03-02 MED ORDER — SODIUM PERTECHNETATE TC 99M INJECTION
10.2000 | Freq: Once | INTRAVENOUS | Status: AC | PRN
  Administered 2013-03-02: 10.2 via INTRAVENOUS

## 2013-03-02 MED ORDER — SODIUM IODIDE I 131 CAPSULE
12.0000 | Freq: Once | INTRAVENOUS | Status: AC | PRN
  Administered 2013-03-02: 12 via ORAL

## 2013-03-08 ENCOUNTER — Other Ambulatory Visit: Payer: Self-pay | Admitting: Internal Medicine

## 2013-03-08 DIAGNOSIS — E0500 Thyrotoxicosis with diffuse goiter without thyrotoxic crisis or storm: Secondary | ICD-10-CM

## 2013-04-16 ENCOUNTER — Encounter (HOSPITAL_COMMUNITY)
Admission: RE | Admit: 2013-04-16 | Discharge: 2013-04-16 | Disposition: A | Discharge status: Home / Self Care | Payer: Managed Care, Other (non HMO) | Source: Ambulatory Visit | Attending: Internal Medicine | Admitting: Internal Medicine

## 2013-04-16 DIAGNOSIS — E0500 Thyrotoxicosis with diffuse goiter without thyrotoxic crisis or storm: Secondary | ICD-10-CM | POA: Insufficient documentation

## 2013-04-16 LAB — HCG, SERUM, QUALITATIVE: Preg, Serum: NEGATIVE

## 2013-04-16 MED ORDER — SODIUM IODIDE I 131 CAPSULE
11.8800 | Freq: Once | INTRAVENOUS | Status: AC | PRN
  Administered 2013-04-16: 11.88 via ORAL

## 2013-04-25 DIAGNOSIS — J029 Acute pharyngitis, unspecified: Secondary | ICD-10-CM

## 2013-04-25 DIAGNOSIS — J069 Acute upper respiratory infection, unspecified: Secondary | ICD-10-CM

## 2013-04-25 DIAGNOSIS — J45901 Unspecified asthma with (acute) exacerbation: Secondary | ICD-10-CM

## 2014-03-15 ENCOUNTER — Encounter (HOSPITAL_COMMUNITY): Payer: Self-pay | Admitting: *Deleted

## 2014-03-15 ENCOUNTER — Emergency Department (HOSPITAL_COMMUNITY)
Admission: EM | Admit: 2014-03-15 | Discharge: 2014-03-15 | Disposition: A | Discharge status: Home / Self Care | Payer: Managed Care, Other (non HMO) | Source: Home / Self Care | Attending: Family Medicine | Admitting: Family Medicine

## 2014-03-15 DIAGNOSIS — N390 Urinary tract infection, site not specified: Secondary | ICD-10-CM

## 2014-03-15 LAB — POCT URINALYSIS DIP (DEVICE)
BILIRUBIN URINE: NEGATIVE
GLUCOSE, UA: NEGATIVE mg/dL
Ketones, ur: NEGATIVE mg/dL
Leukocytes, UA: NEGATIVE
Nitrite: NEGATIVE
Protein, ur: 30 mg/dL — AB
SPECIFIC GRAVITY, URINE: 1.02 (ref 1.005–1.030)
Urobilinogen, UA: 0.2 mg/dL (ref 0.0–1.0)
pH: 6 (ref 5.0–8.0)

## 2014-03-15 MED ORDER — CEPHALEXIN 500 MG PO CAPS: 500.0000 mg | ORAL_CAPSULE | Freq: Four times a day (QID) | ORAL | Status: DC

## 2014-03-15 MED ORDER — FLUCONAZOLE 150 MG PO TABS
150.0000 mg | ORAL_TABLET | Freq: Once | ORAL | Status: DC
Start: 2014-03-15 — End: 2015-09-09

## 2014-03-15 NOTE — Discharge Instructions (Signed)
Take all of medicine as directed, drink lots of fluids, see your doctor if further problems and for blood pressure recheck and treatment as indicated.

## 2014-03-15 NOTE — ED Provider Notes (Addendum)
CSN: [id number]     Arrival date & time 03/15/14  1317 History   First MD Initiated Contact with Patient 03/15/14 1359     Chief Complaint  Patient presents with  . Urinary Tract Infection   (Consider location/radiation/quality/duration/timing/severity/associated sxs/prior Treatment) Patient is a 43 y.o. female presenting with urinary tract infection. The history is provided by the patient.  Urinary Tract Infection This is a new problem. The current episode started more than 1 week ago (almost 2 wks of sx.). The problem has been gradually worsening. Pertinent negatives include no abdominal pain.    Past Medical History  Diagnosis Date  . Hypertension   . Goiter    Past Surgical History  Procedure Laterality Date  . Abdominal hysterectomy     History reviewed. No pertinent family history. History  Substance Use Topics  . Smoking status: Never Smoker   . Smokeless tobacco: Not on file  . Alcohol Use: Yes   OB History    No data available     Review of Systems  Constitutional: Negative.   Gastrointestinal: Negative.  Negative for abdominal pain.  Genitourinary: Positive for dysuria, urgency and frequency. Negative for vaginal discharge and difficulty urinating.    Allergies  Review of patient's allergies indicates no known allergies.  Home Medications   Prior to Admission medications   Medication Sig Start Date End Date Taking? Authorizing Provider  cephALEXin (KEFLEX) 500 MG capsule Take 1 capsule (500 mg total) by mouth 4 (four) times daily. Take all of medicine and drink lots of fluids 03/15/14   Linna HoffJames D Bridgid Printz, MD  fexofenadine (ALLEGRA) 180 MG tablet Take 1 tablet (180 mg total) by mouth daily. 02/07/13   Linna HoffJames D Monna Crean, MD  fluconazole (DIFLUCAN) 150 MG tablet Take 1 tablet (150 mg total) by mouth once. Repeat in 1 week if needed 03/15/14   Linna HoffJames D Lucette Kratz, MD  fluticasone [facility](FLONASE) 50 MCG/ACT nasal spray Place 2 sprays into the nose 2 (two) times daily. 02/07/13    Linna HoffJames D Lamonica Trueba, MD  furosemide (LASIX) 40 MG tablet Take 1 tablet (40 mg total) by mouth every morning. 12/13/12   Graylon GoodZachary H Baker, PA-C  levothyroxine (SYNTHROID, LEVOTHROID) 88 MCG tablet Take 88 mcg by mouth daily.      Historical Provider, MD  Multiple Vitamin (MULTIVITAMIN) tablet Take 1 tablet by mouth daily.      Historical Provider, MD  olmesartan-hydrochlorothiazide (BENICAR HCT) 40-12.5 MG per tablet Take 1 tablet by mouth daily.      Historical Provider, MD  traMADol (ULTRAM) 50 MG tablet Take 1 tablet (50 mg total) by mouth every 6 (six) hours as needed for pain. 12/13/12   Adrian BlackwaterZachary H Baker, PA-C   BP 160/110 mmHg  Pulse 72  Temp(Src) 98.6 F (37 C) (Oral)  Resp 16  SpO2 100% Physical Exam  Constitutional: She is oriented to person, place, and time. She appears well-developed and well-nourished.  Abdominal: Soft. Bowel sounds are normal. She exhibits no distension and no mass. There is no tenderness. There is no rebound and no guarding.  Neurological: She is alert and oriented to person, place, and time.  Skin: Skin is warm and dry.  Nursing note and vitals reviewed.   ED Course  Procedures (including critical care time) Labs Review Labs Reviewed  POCT URINALYSIS DIP (DEVICE) - Abnormal; Notable for the following:    Hgb urine dipstick MODERATE (*)    Protein, ur 30 (*)    All other components within normal limits  Imaging Review No results found.   MDM   1. UTI (lower urinary tract infection)        Linna HoffJames D Gaetano Romberger, MD 03/15/14 1451  Linna HoffJames D Jillien Yakel, MD 03/15/14 [phone]

## 2014-04-25 ENCOUNTER — Emergency Department (HOSPITAL_COMMUNITY)
Admission: EM | Admit: 2014-04-25 | Discharge: 2014-04-25 | Disposition: A | Discharge status: Home / Self Care | Payer: Managed Care, Other (non HMO) | Source: Home / Self Care | Attending: Family Medicine | Admitting: Family Medicine

## 2014-04-25 ENCOUNTER — Encounter (HOSPITAL_COMMUNITY): Payer: Self-pay

## 2014-04-25 DIAGNOSIS — J4531 Mild persistent asthma with (acute) exacerbation: Secondary | ICD-10-CM

## 2014-04-25 DIAGNOSIS — J069 Acute upper respiratory infection, unspecified: Secondary | ICD-10-CM

## 2014-04-25 DIAGNOSIS — J029 Acute pharyngitis, unspecified: Secondary | ICD-10-CM

## 2014-04-25 LAB — POCT RAPID STREP A: Streptococcus, Group A Screen (Direct): NEGATIVE

## 2014-04-25 MED ORDER — PREDNISONE 20 MG PO TABS
60.0000 mg | ORAL_TABLET | Freq: Once | ORAL | Status: AC
  Administered 2014-04-25: 60 mg via ORAL

## 2014-04-25 MED ORDER — IPRATROPIUM-ALBUTEROL 0.5-2.5 (3) MG/3ML IN SOLN
RESPIRATORY_TRACT | Status: AC
  Filled 2014-04-25: qty 3

## 2014-04-25 MED ORDER — ALBUTEROL SULFATE (2.5 MG/3ML) 0.083% IN NEBU
2.5000 mg | INHALATION_SOLUTION | Freq: Once | RESPIRATORY_TRACT | Status: AC
  Administered 2014-04-25: 2.5 mg via RESPIRATORY_TRACT

## 2014-04-25 MED ORDER — PREDNISONE 20 MG PO TABS
ORAL_TABLET | ORAL | Status: AC
Start: 2014-04-25 — End: 2014-04-25
  Filled 2014-04-25: qty 3

## 2014-04-25 MED ORDER — ALBUTEROL SULFATE HFA 108 (90 BASE) MCG/ACT IN AERS: 2.0000 | INHALATION_SPRAY | RESPIRATORY_TRACT | Status: AC | PRN

## 2014-04-25 MED ORDER — PREDNISONE 20 MG PO TABS: ORAL_TABLET | ORAL | Status: DC

## 2014-04-25 MED ORDER — IPRATROPIUM-ALBUTEROL 0.5-2.5 (3) MG/3ML IN SOLN
3.0000 mL | Freq: Once | RESPIRATORY_TRACT | Status: AC
  Administered 2014-04-25: 3 mL via RESPIRATORY_TRACT

## 2014-04-25 NOTE — Discharge Instructions (Signed)
Bronchospasm A bronchospasm is a spasm or tightening of the airways going into the lungs. During a bronchospasm breathing becomes more difficult because the airways get smaller. When this happens there can be coughing, a whistling sound when breathing (wheezing), and difficulty breathing. Bronchospasm is often associated with asthma, but not all patients who experience a bronchospasm have asthma. CAUSES  A bronchospasm is caused by inflammation or irritation of the airways. The inflammation or irritation may be triggered by:   Allergies (such as to animals, pollen, food, or mold). Allergens that cause bronchospasm may cause wheezing immediately after exposure or many hours later.   Infection. Viral infections are believed to be the most common cause of bronchospasm.   Exercise.   Irritants (such as pollution, cigarette smoke, strong odors, aerosol sprays, and paint fumes).   Weather changes. Winds increase molds and pollens in the air. Rain refreshes the air by washing irritants out. Cold air may cause inflammation.   Stress and emotional upset.  SIGNS AND SYMPTOMS   Wheezing.   Excessive nighttime coughing.   Frequent or severe coughing with a simple cold.   Chest tightness.   Shortness of breath.  DIAGNOSIS  Bronchospasm is usually diagnosed through a history and physical exam. Tests, such as chest X-rays, are sometimes done to look for other conditions. TREATMENT   Inhaled medicines can be given to open up your airways and help you breathe. The medicines can be given using either an inhaler or a nebulizer machine.  Corticosteroid medicines may be given for severe bronchospasm, usually when it is associated with asthma. HOME CARE INSTRUCTIONS   Always have a plan prepared for seeking medical care. Know when to call your health care provider and local emergency services (911 in the [place] Know where you can access local emergency care.  Only take medicines as  directed by your health care provider.  If you were prescribed an inhaler or nebulizer machine, ask your health care provider to explain how to use it correctly. Always use a spacer with your inhaler if you were given one.  It is necessary to remain calm during an attack. Try to relax and breathe more slowly.  Control your home environment in the following ways:   Change your heating and air conditioning filter at least once a month.   Limit your use of fireplaces and wood stoves.  Do not smoke and do not allow smoking in your home.   Avoid exposure to perfumes and fragrances.   Get rid of pests (such as roaches and mice) and their droppings.   Throw away plants if you see mold on them.   Keep your house clean and dust free.   Replace carpet with wood, tile, or vinyl flooring. Carpet can trap dander and dust.   Use allergy-proof pillows, mattress covers, and box spring covers.   Wash bed sheets and blankets every week in hot water and dry them in a dryer.   Use blankets that are made of polyester or cotton.   Wash hands frequently. SEEK MEDICAL CARE IF:   You have muscle aches.   You have chest pain.   The sputum changes from clear or white to yellow, green, gray, or bloody.   The sputum you cough up gets thicker.   There are problems that may be related to the medicine you are given, such as a rash, itching, swelling, or trouble breathing.  SEEK IMMEDIATE MEDICAL CARE IF:   You have worsening wheezing and coughing even  after taking your prescribed medicines.   You have increased difficulty breathing.   You develop severe chest pain. MAKE SURE YOU:   Understand these instructions.  Will watch your condition.  Will get help right away if you are not doing well or get worse. Document Released: 04/08/2003 Document Revised: 04/10/2013 Document Reviewed: 09/25/2012 [facility] Patient Information 2015 [place], [place]. This information is not  intended to replace advice given to you by your health care provider. Make sure you discuss any questions you have with your health care provider.  How to Use an Inhaler Using your inhaler correctly is very important. Good technique will make sure that the medicine reaches your lungs.  HOW TO USE AN INHALER:  Take the cap off the inhaler.  If this is the first time using your inhaler, you need to prime it. Shake the inhaler for 5 seconds. Release four puffs into the air, away from your face. Ask your doctor for help if you have questions.  Shake the inhaler for 5 seconds.  Turn the inhaler so the bottle is above the mouthpiece.  Put your pointer finger on top of the bottle. Your thumb holds the bottom of the inhaler.  Open your mouth.  Either hold the inhaler away from your mouth (the width of 2 fingers) or place your lips tightly around the mouthpiece. Ask your doctor which way to use your inhaler.  Breathe out as much air as possible.  Breathe in and push down on the bottle 1 time to release the medicine. You will feel the medicine go in your mouth and throat.  Continue to take a deep breath in very slowly. Try to fill your lungs.  After you have breathed in completely, hold your breath for 10 seconds. This will help the medicine to settle in your lungs. If you cannot hold your breath for 10 seconds, hold it for as long as you can before you breathe out.  Breathe out slowly, through pursed lips. Whistling is an example of pursed lips.  If your doctor has told you to take more than 1 puff, wait at least 15-30 seconds between puffs. This will help you get the best results from your medicine. Do not use the inhaler more than your doctor tells you to.  Put the cap back on the inhaler.  Follow the directions from your doctor or from the inhaler package about cleaning the inhaler. If you use more than one inhaler, ask your doctor which inhalers to use and what order to use them in. Ask  your doctor to help you figure out when you will need to refill your inhaler.  If you use a steroid inhaler, always rinse your mouth with water after your last puff, gargle and spit out the water. Do not swallow the water. GET HELP IF:  The inhaler medicine only partially helps to stop wheezing or shortness of breath.  You are having trouble using your inhaler.  You have some increase in thick spit (phlegm). GET HELP RIGHT AWAY IF:  The inhaler medicine does not help your wheezing or shortness of breath or you have tightness in your chest.  You have dizziness, headaches, or fast heart rate.  You have chills, fever, or night sweats.  You have a large increase of thick spit, or your thick spit is bloody. MAKE SURE YOU:   Understand these instructions.  Will watch your condition.  Will get help right away if you are not doing well or get worse. Document  Released: 01/13/2008 Document Revised: 01/24/2013 Document Reviewed: 11/02/2012 [facility] Patient Information 2015 [place], [place]. This information is not intended to replace advice given to you by your health care provider. Make sure you discuss any questions you have with your health care provider.  Pharyngitis Pharyngitis is a sore throat (pharynx). There is redness, pain, and swelling of your throat. HOME CARE   Drink enough fluids to keep your pee (urine) clear or pale yellow.  Only take medicine as told by your doctor.  You may get sick again if you do not take medicine as told. Finish your medicines, even if you start to feel better.  Do not take aspirin.  Rest.  Rinse your mouth (gargle) with salt water ( tsp of salt per 1 qt of water) every 1-2 hours. This will help the pain.  If you are not at risk for choking, you can suck on hard candy or sore throat lozenges. GET HELP IF:  You have large, tender lumps on your neck.  You have a rash.  You cough up green, yellow-brown, or bloody spit. GET HELP RIGHT AWAY  IF:   You have a stiff neck.  You drool or cannot swallow liquids.  You throw up (vomit) or are not able to keep medicine or liquids down.  You have very bad pain that does not go away with medicine.  You have problems breathing (not from a stuffy nose). MAKE SURE YOU:   Understand these instructions.  Will watch your condition.  Will get help right away if you are not doing well or get worse. Document Released: 09/22/2007 Document Revised: 01/24/2013 Document Reviewed: 12/11/2012 [facility] Patient Information 2015 [place], [place]. This information is not intended to replace advice given to you by your health care provider. Make sure you discuss any questions you have with your health care provider.  Upper Respiratory Infection, Adult OTC meds as discussed for cold and congestion symptoms An upper respiratory infection (URI) is also sometimes known as the common cold. The upper respiratory tract includes the nose, sinuses, throat, trachea, and bronchi. Bronchi are the airways leading to the lungs. Most people improve within 1 week, but symptoms can last up to 2 weeks. A residual cough may last even longer.  CAUSES Many different viruses can infect the tissues lining the upper respiratory tract. The tissues become irritated and inflamed and often become very moist. Mucus production is also common. A cold is contagious. You can easily spread the virus to others by oral contact. This includes kissing, sharing a glass, coughing, or sneezing. Touching your mouth or nose and then touching a surface, which is then touched by another person, can also spread the virus. SYMPTOMS  Symptoms typically develop 1 to 3 days after you come in contact with a cold virus. Symptoms vary from person to person. They may include:  Runny nose.  Sneezing.  Nasal congestion.  Sinus irritation.  Sore throat.  Loss of voice (laryngitis).  Cough.  Fatigue.  Muscle aches.  Loss of  appetite.  Headache.  Low-grade fever. DIAGNOSIS  You might diagnose your own cold based on familiar symptoms, since most people get a cold 2 to 3 times a year. Your caregiver can confirm this based on your exam. Most importantly, your caregiver can check that your symptoms are not due to another disease such as strep throat, sinusitis, pneumonia, asthma, or epiglottitis. Blood tests, throat tests, and X-rays are not necessary to diagnose a common cold, but they may sometimes be helpful  in excluding other more serious diseases. Your caregiver will decide if any further tests are required. RISKS AND COMPLICATIONS  You may be at risk for a more severe case of the common cold if you smoke cigarettes, have chronic heart disease (such as heart failure) or lung disease (such as asthma), or if you have a weakened immune system. The very young and very old are also at risk for more serious infections. Bacterial sinusitis, middle ear infections, and bacterial pneumonia can complicate the common cold. The common cold can worsen asthma and chronic obstructive pulmonary disease (COPD). Sometimes, these complications can require emergency medical care and may be life-threatening. PREVENTION  The best way to protect against getting a cold is to practice good hygiene. Avoid oral or hand contact with people with cold symptoms. Wash your hands often if contact occurs. There is no clear evidence that vitamin C, vitamin E, echinacea, or exercise reduces the chance of developing a cold. However, it is always recommended to get plenty of rest and practice good nutrition. TREATMENT  Treatment is directed at relieving symptoms. There is no cure. Antibiotics are not effective, because the infection is caused by a virus, not by bacteria. Treatment may include:  Increased fluid intake. Sports drinks offer valuable electrolytes, sugars, and fluids.  Breathing heated mist or steam (vaporizer or shower).  Eating chicken soup  or other clear broths, and maintaining good nutrition.  Getting plenty of rest.  Using gargles or lozenges for comfort.  Controlling fevers with ibuprofen or acetaminophen as directed by your caregiver.  Increasing usage of your inhaler if you have asthma. Zinc gel and zinc lozenges, taken in the first 24 hours of the common cold, can shorten the duration and lessen the severity of symptoms. Pain medicines may help with fever, muscle aches, and throat pain. A variety of non-prescription medicines are available to treat congestion and runny nose. Your caregiver can make recommendations and may suggest nasal or lung inhalers for other symptoms.  HOME CARE INSTRUCTIONS   Only take over-the-counter or prescription medicines for pain, discomfort, or fever as directed by your caregiver.  Use a warm mist humidifier or inhale steam from a shower to increase air moisture. This may keep secretions moist and make it easier to breathe.  Drink enough water and fluids to keep your urine clear or pale yellow.  Rest as needed.  Return to work when your temperature has returned to normal or as your caregiver advises. You may need to stay home longer to avoid infecting others. You can also use a face mask and careful hand washing to prevent spread of the virus. SEEK MEDICAL CARE IF:   After the first few days, you feel you are getting worse rather than better.  You need your caregiver's advice about medicines to control symptoms.  You develop chills, worsening shortness of breath, or brown or red sputum. These may be signs of pneumonia.  You develop yellow or brown nasal discharge or pain in the face, especially when you bend forward. These may be signs of sinusitis.  You develop a fever, swollen neck glands, pain with swallowing, or white areas in the back of your throat. These may be signs of strep throat. SEEK IMMEDIATE MEDICAL CARE IF:   You have a fever.  You develop severe or persistent  headache, ear pain, sinus pain, or chest pain.  You develop wheezing, a prolonged cough, cough up blood, or have a change in your usual mucus (if you have chronic  lung disease).  You develop sore muscles or a stiff neck. Document Released: 09/29/2000 Document Revised: 06/28/2011 Document Reviewed: 07/11/2013 [facility] Patient Information 2015 [place], [place]. This information is not intended to replace advice given to you by your health care provider. Make sure you discuss any questions you have with your health care provider.

## 2014-04-25 NOTE — ED Notes (Signed)
Patient complains of sore throat congestion and cough that started about One week ago.  Patient states she is coughing up thick yellow mucous Patient was starting to feel better but has gotten progressively worse

## 2014-04-25 NOTE — ED Provider Notes (Signed)
CSN: [id number]     Arrival date & time 04/25/14  1729 History   First MD Initiated Contact with Patient 04/25/14 1800     Chief Complaint  Patient presents with  . Sore Throat   (Consider location/radiation/quality/duration/timing/severity/associated sxs/prior Treatment) HPI Comments: 44 year old female complaining of a cough, chest congestion, head congestion, nasal congestion and runny nose with minor sore throat. Denies shortness of breath. She had fever a few days ago but none in the past 2 days. Symptoms began approximately one week ago.   Past Medical History  Diagnosis Date  . Hypertension   . Goiter    Past Surgical History  Procedure Laterality Date  . Abdominal hysterectomy     No family history on file. History  Substance Use Topics  . Smoking status: Never Smoker   . Smokeless tobacco: Not on file  . Alcohol Use: Yes   OB History    No data available     Review of Systems  Constitutional: Positive for fever, activity change and appetite change. Negative for chills and fatigue.  HENT: Positive for congestion, postnasal drip and rhinorrhea. Negative for ear pain and facial swelling.   Eyes: Negative.   Respiratory: Positive for cough. Negative for shortness of breath, wheezing and stridor.   Cardiovascular: Negative.   Gastrointestinal: Negative.   Musculoskeletal: Negative for neck pain and neck stiffness.  Skin: Negative for pallor and rash.  Neurological: Negative.     Allergies  Review of patient's allergies indicates no known allergies.  Home Medications   Prior to Admission medications   Medication Sig Start Date End Date Taking? Authorizing Provider  albuterol (PROVENTIL HFA;VENTOLIN HFA) 108 (90 BASE) MCG/ACT inhaler Inhale 2 puffs into the lungs every 4 (four) hours as needed for wheezing or shortness of breath. 04/25/14   Hayden Rasmussenavid Krislyn Donnan, NP  cephALEXin (KEFLEX) 500 MG capsule Take 1 capsule (500 mg total) by mouth 4 (four) times daily. Take all of  medicine and drink lots of fluids 03/15/14   Linna HoffJames D Kindl, MD  fexofenadine (ALLEGRA) 180 MG tablet Take 1 tablet (180 mg total) by mouth daily. 02/07/13   Linna HoffJames D Kindl, MD  fluconazole (DIFLUCAN) 150 MG tablet Take 1 tablet (150 mg total) by mouth once. Repeat in 1 week if needed 03/15/14   Linna HoffJames D Kindl, MD  fluticasone [facility](FLONASE) 50 MCG/ACT nasal spray Place 2 sprays into the nose 2 (two) times daily. 02/07/13   Linna HoffJames D Kindl, MD  furosemide (LASIX) 40 MG tablet Take 1 tablet (40 mg total) by mouth every morning. 12/13/12   Graylon GoodZachary H Baker, PA-C  levothyroxine (SYNTHROID, LEVOTHROID) 88 MCG tablet Take 88 mcg by mouth daily.      Historical Provider, MD  Multiple Vitamin (MULTIVITAMIN) tablet Take 1 tablet by mouth daily.      Historical Provider, MD  olmesartan-hydrochlorothiazide (BENICAR HCT) 40-12.5 MG per tablet Take 1 tablet by mouth daily.      Historical Provider, MD  predniSONE (DELTASONE) 20 MG tablet Take 3 tabs po on first day, 2 tabs second day, 2 tabs third day, 1 tab fourth day, 1 tab 5th day. Take with food. Start 04/26/14. 04/25/14   Hayden Rasmussenavid Jep Dyas, NP  traMADol (ULTRAM) 50 MG tablet Take 1 tablet (50 mg total) by mouth every 6 (six) hours as needed for pain. 12/13/12   Adrian BlackwaterZachary H Baker, PA-C   BP 172/112 mmHg  Pulse 93  Temp(Src) 98.8 F (37.1 C) (Oral)  Resp 20  SpO2 95% Physical Exam  Constitutional: She is oriented to person, place, and time. She appears well-developed and well-nourished. No distress.  HENT:  Mouth/Throat: No oropharyngeal exudate.  Bilateral TMs are normal Oropharynx with minor erythema and clear PND.  Eyes: Conjunctivae and EOM are normal.  Neck: Normal range of motion. Neck supple.  Cardiovascular: Normal rate, regular rhythm and normal heart sounds.   Pulmonary/Chest: Effort normal. No respiratory distress.  Diminished air movement. Cough reveals wheezing.  Musculoskeletal: Normal range of motion. She exhibits no edema.  Lymphadenopathy:    She has no  cervical adenopathy.  Neurological: She is alert and oriented to person, place, and time.  Skin: Skin is warm and dry. No rash noted.  Psychiatric: She has a normal mood and affect.  Nursing note and vitals reviewed.   ED Course  Procedures (including critical care time) Labs Review Labs Reviewed  POCT RAPID STREP A ([facility] ONLY)   Results for orders placed or performed during the hospital encounter of 04/25/14  POCT rapid strep A [facility])  Result Value Ref Range   Streptococcus, Group A Screen (Direct) NEGATIVE NEGATIVE    Imaging Review No results found.   MDM   1. URI (upper respiratory infection)   2. RAD (reactive airway disease) with wheezing, mild persistent, with acute exacerbation   3. Pharyngitis    Mild improvement post Duoneb 5 mg, increased air movement. Albuterol HFA Prednisone Prednisone 60 mg PO now.  For fevers, increase cough, shortness of breath despite medications go to the ED. Red flags discussed with pt.    \  Hayden Rasmussen, NP 04/25/14 [phone]

## 2014-04-27 LAB — CULTURE, GROUP A STREP

## 2014-09-20 ENCOUNTER — Other Ambulatory Visit: Payer: Self-pay | Admitting: Obstetrics and Gynecology

## 2014-09-23 LAB — CYTOLOGY - PAP

## 2015-07-31 IMAGING — CR DG CHEST 2V
2 series · 2 of 2 positions shown · non-contrast
Comparison: None.

CLINICAL DATA: Dyspnea for 3 weeks, hypertension, nonsmoker

EXAM:
CHEST  2 VIEW

[w chest pa]
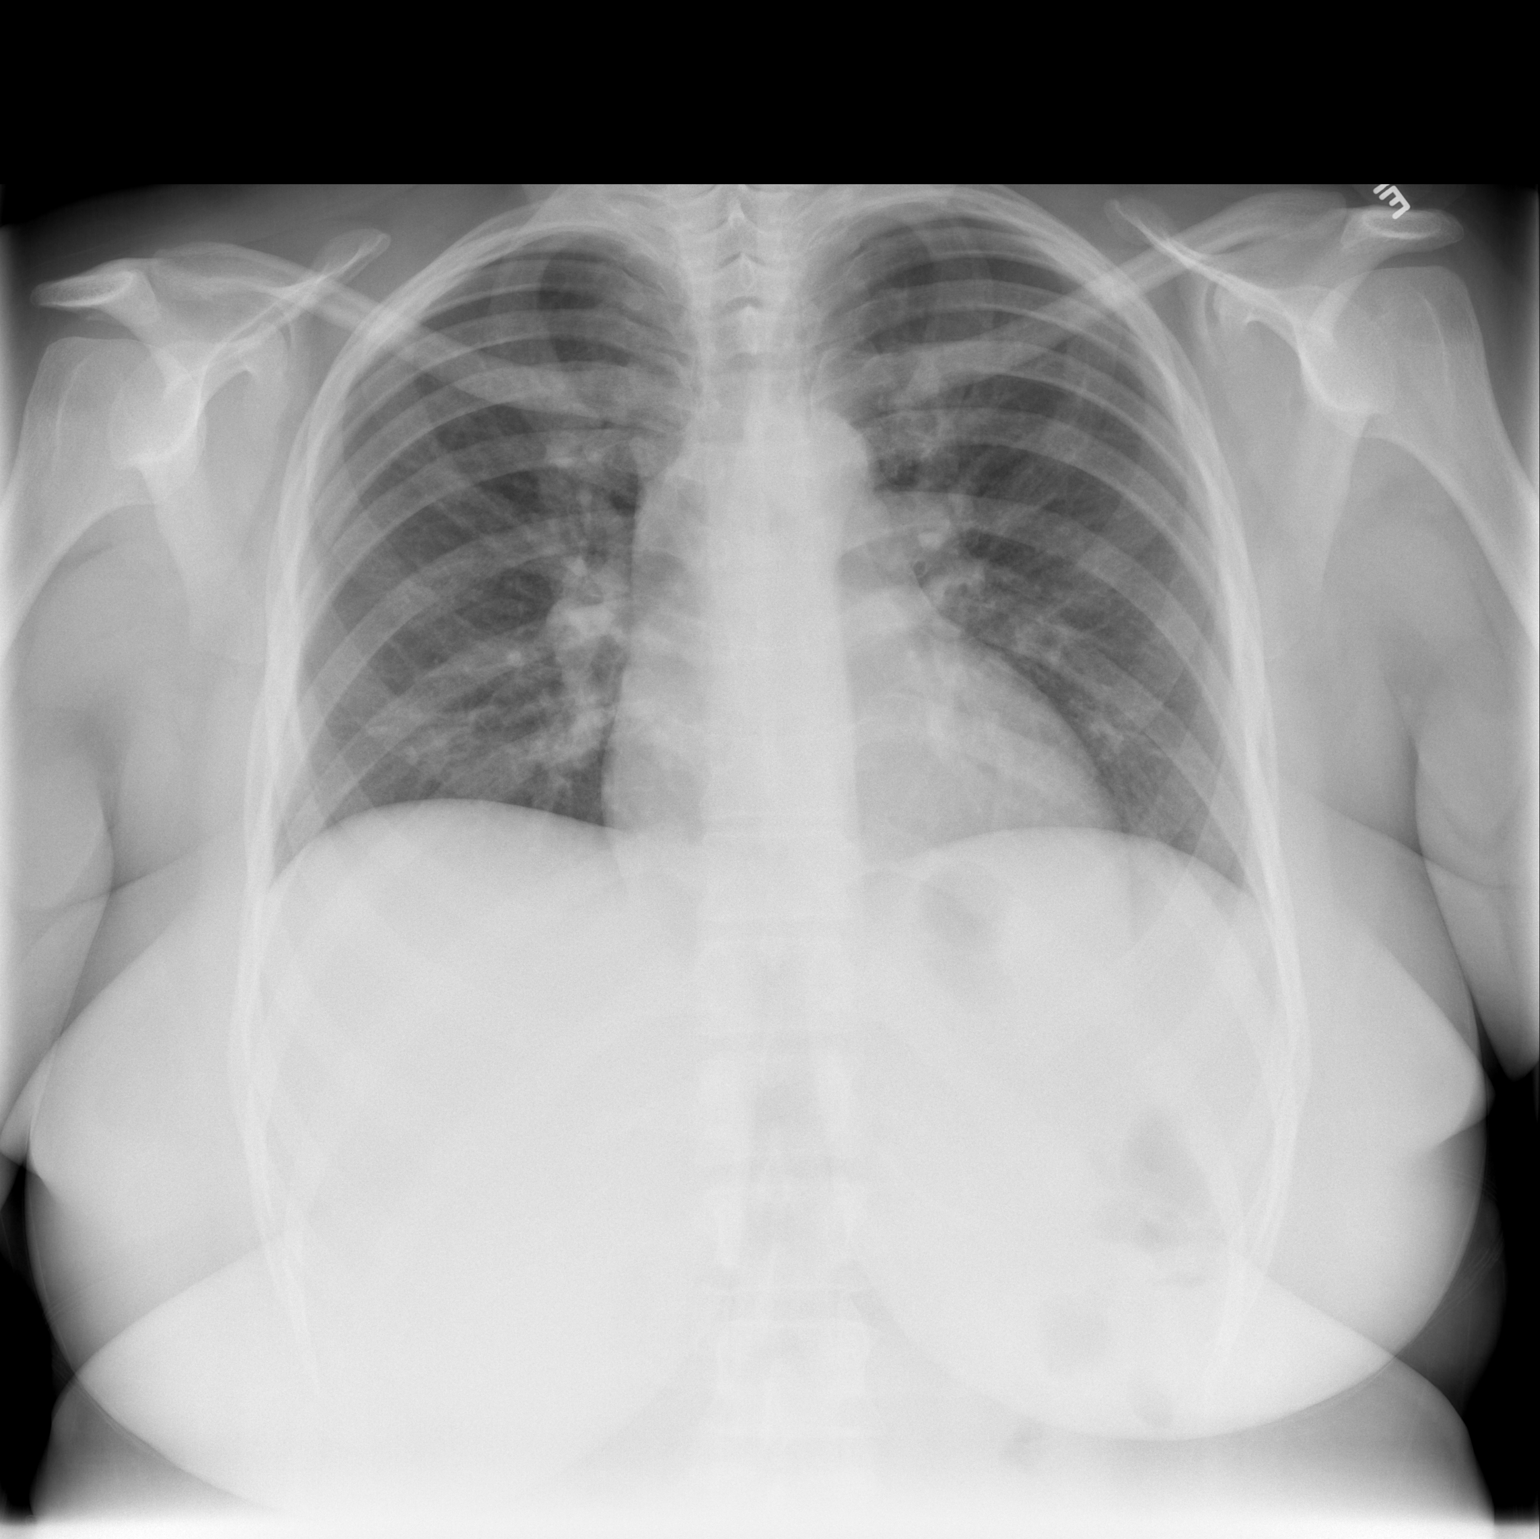

[w chest lat]
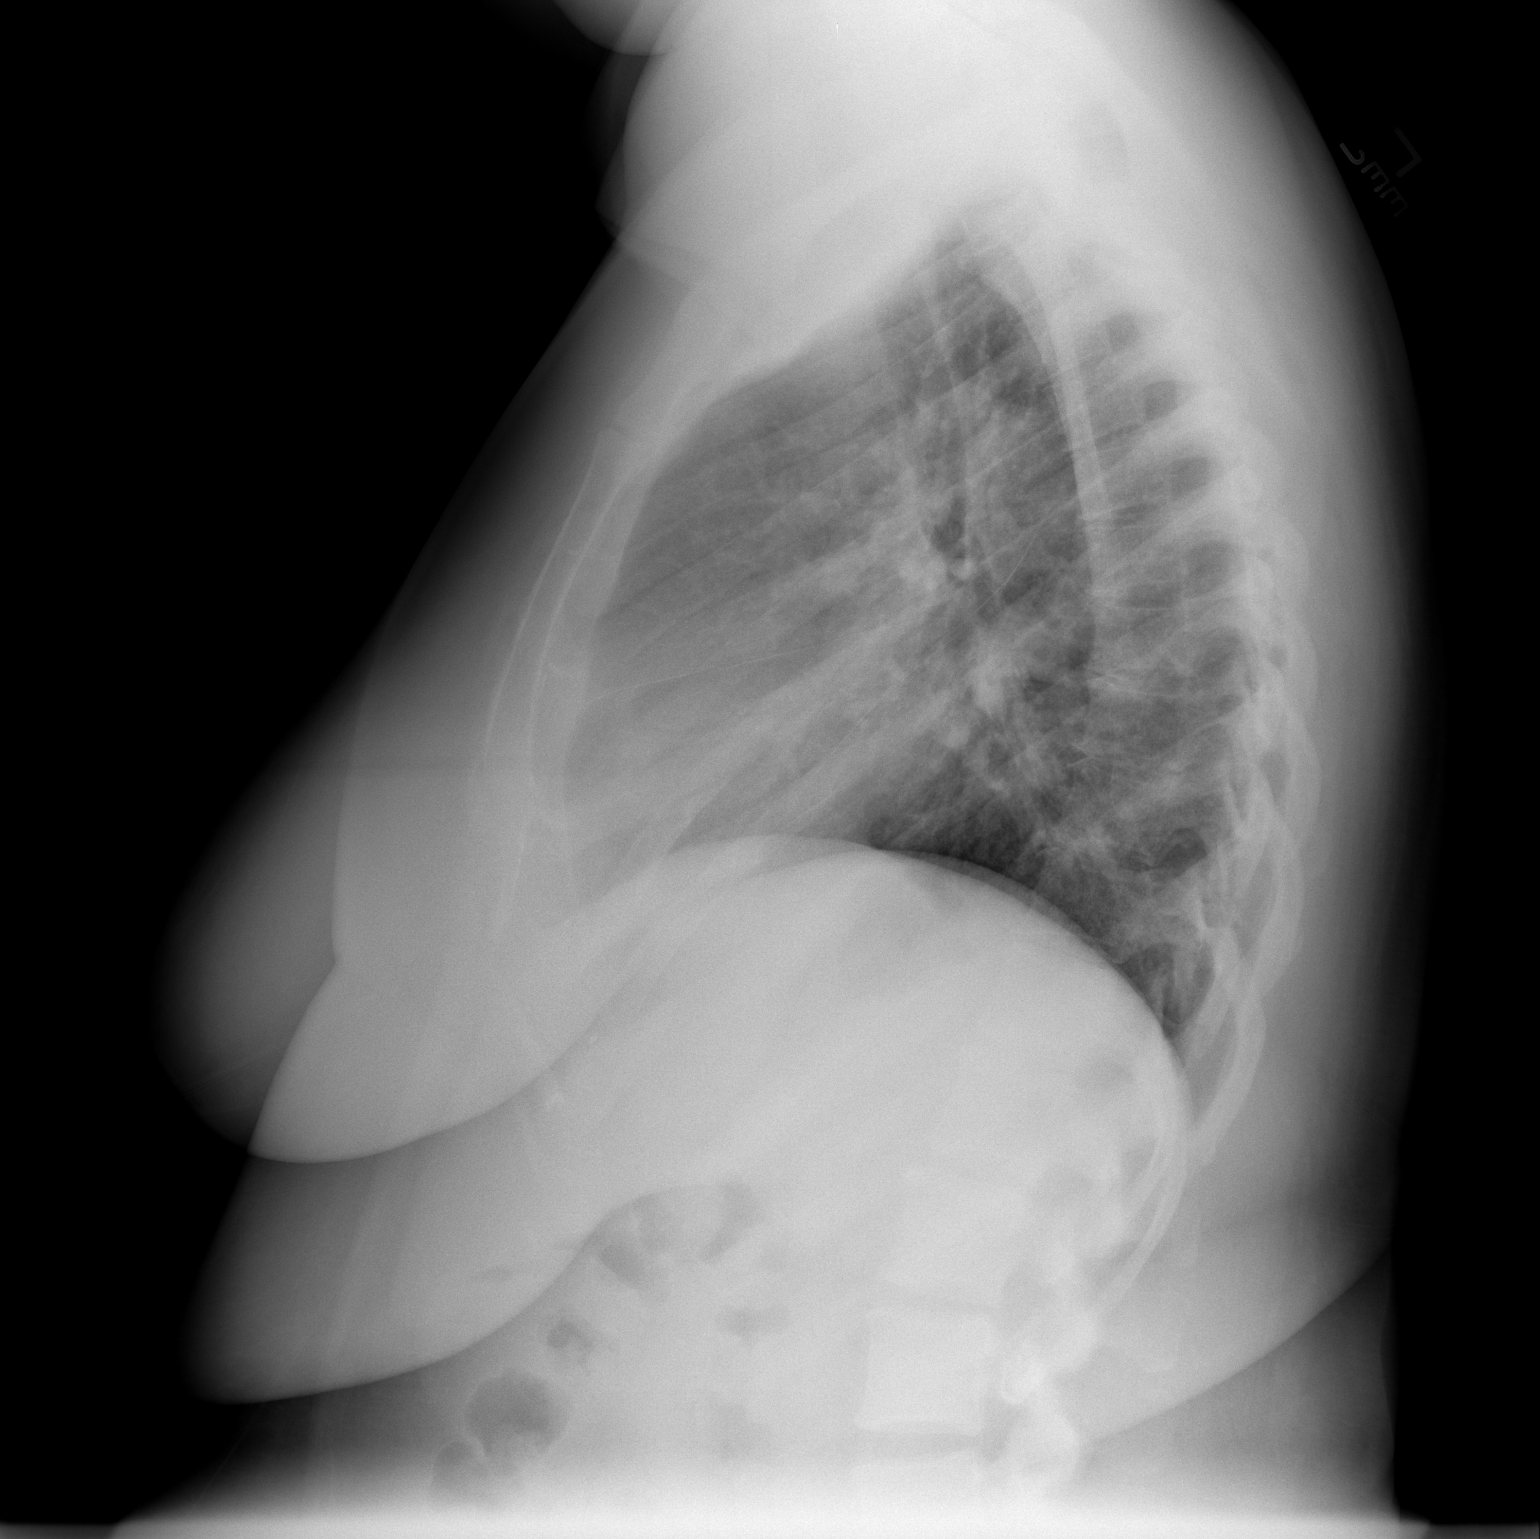

[2 of 2 positions shown; findings below may reference images not displayed]

FINDINGS: The heart size and mediastinal contours are within normal limits.
Both lungs are clear. Minimal degenerative changes thoracic spine.
IMPRESSION: No active cardiopulmonary disease.

## 2015-09-09 ENCOUNTER — Encounter (HOSPITAL_COMMUNITY): Payer: Self-pay | Admitting: *Deleted

## 2015-09-09 ENCOUNTER — Ambulatory Visit (HOSPITAL_COMMUNITY)
Admission: EM | Admit: 2015-09-09 | Discharge: 2015-09-09 | Disposition: A | Discharge status: Home / Self Care | Payer: Managed Care, Other (non HMO) | Attending: Emergency Medicine | Admitting: Emergency Medicine

## 2015-09-09 DIAGNOSIS — M25562 Pain in left knee: Secondary | ICD-10-CM | POA: Diagnosis not present

## 2015-09-09 MED ORDER — DICLOFENAC SODIUM 3 % TD GEL: TRANSDERMAL | Status: AC

## 2015-09-09 MED ORDER — MELOXICAM 15 MG PO TABS: 15.0000 mg | ORAL_TABLET | Freq: Every day | ORAL | Status: DC

## 2015-09-09 NOTE — ED Notes (Signed)
l  Knee  Pain          X   Several  Weeks       denys  Any  specefic  Injury  Other  Than  Stepping  Off a  Curb  - the  Pain is  In  [employer]  Front of  The  Knee    And     She  Has  Been tring ice/  Elevation   Etc  To  Help  The  Symptoms

## 2015-09-09 NOTE — Discharge Instructions (Signed)
You have some inflammation of the patellar tendon and bursa in your knee. This is likely from a combination of Zumba and stepping off a curb wrong. Take meloxicam daily for 1 week, then as needed. Use the diclofenac gel 4 times a day for the next week. Continue to elevate and ice her knee frequently. No Zumba or excessive walking for the next week. If this is not improving in 1 week, please follow-up with sports medicine.

## 2015-09-09 NOTE — ED Provider Notes (Signed)
CSN: [id number]     Arrival date & time 09/09/15  1808 History   First MD Initiated Contact with Patient 09/09/15 1907     Chief Complaint  Patient presents with  . Knee Pain   (Consider location/radiation/quality/duration/timing/severity/associated sxs/prior Treatment) HPI She is a 45 year old woman here for evaluation of left knee pain. She states this started about 2 weeks ago and has gradually gotten worse. The pain is located over the anterior knee. It is worse with weightbearing and fully extending her knee. She did start an exercise regimen of Zumba about 3 months ago.  She also reports missing a curb about 2-3 weeks ago. She does report some swelling of the anterior lateral knee, particularly after prolonged walking or exercise. She has been doing elevation and ice with some improvement.  Past Medical History  Diagnosis Date  . Hypertension   . Goiter    Past Surgical History  Procedure Laterality Date  . Abdominal hysterectomy     History reviewed. No pertinent family history. Social History  Substance Use Topics  . Smoking status: Never Smoker   . Smokeless tobacco: None  . Alcohol Use: Yes   OB History    No data available     Review of Systems As in history of present illness Allergies  Review of patient's allergies indicates no known allergies.  Home Medications   Prior to Admission medications   Medication Sig Start Date End Date Taking? Authorizing Provider  albuterol (PROVENTIL HFA;VENTOLIN HFA) 108 (90 BASE) MCG/ACT inhaler Inhale 2 puffs into the lungs every 4 (four) hours as needed for wheezing or shortness of breath. 04/25/14   Hayden Rasmussenavid Mabe, NP  Diclofenac Sodium 3 % GEL Apply topically 4 times a day 09/09/15   Charm RingsErin J Zori Benbrook, MD  fexofenadine (ALLEGRA) 180 MG tablet Take 1 tablet (180 mg total) by mouth daily. 02/07/13   Linna HoffJames D Kindl, MD  fluticasone (FLONASE) 50 MCG/ACT nasal spray Place 2 sprays into the nose 2 (two) times daily. 02/07/13   Linna HoffJames D Kindl,  MD  furosemide (LASIX) 40 MG tablet Take 1 tablet (40 mg total) by mouth every morning. 12/13/12   Graylon GoodZachary H Baker, PA-C  levothyroxine (SYNTHROID, LEVOTHROID) 88 MCG tablet Take 88 mcg by mouth daily.      Historical Provider, MD  meloxicam (MOBIC) 15 MG tablet Take 1 tablet (15 mg total) by mouth daily. 09/09/15   Charm RingsErin J Nailah Luepke, MD  Multiple Vitamin (MULTIVITAMIN) tablet Take 1 tablet by mouth daily.      Historical Provider, MD  olmesartan-hydrochlorothiazide (BENICAR HCT) 40-12.5 MG per tablet Take 1 tablet by mouth daily.      Historical Provider, MD   Meds Ordered and Administered this Visit  Medications - No data to display  BP 120/91 mmHg  Pulse 78  Temp(Src) 98.3 F (36.8 C) (Oral)  Resp 16  SpO2 100% No data found.   Physical Exam  Constitutional: She is oriented to person, place, and time. She appears well-developed and well-nourished. No distress.  Cardiovascular: Normal rate.   Pulmonary/Chest: Effort normal.  Musculoskeletal: She exhibits edema (she does have trace edema bilaterally, slightly worse on the left.).  Left knee: No erythema. No appreciable joint effusion. She does have some slight swelling lateral to the patellar tendon. She is tender to palpation over the patellar tendon and just lateral. No joint line tenderness. Full active range of motion. No joint laxity.  Neurological: She is alert and oriented to person, place, and time.  ED Course  Procedures (including critical care time)  Labs Review Labs Reviewed - No data to display  Imaging Review No results found.    MDM   1. Left anterior knee pain    Likely patellar tendinitis due to combination of exercise program and stepping off curb wrong. Conservative management with rest, ice, elevation. Will also add meloxicam and diclofenac gel. Follow-up if not improving over the next week.    Charm Rings, MD 09/09/15 [phone]

## 2017-06-13 ENCOUNTER — Encounter: Payer: Self-pay | Admitting: *Deleted

## 2017-06-13 ENCOUNTER — Ambulatory Visit
Admission: EM | Admit: 2017-06-13 | Discharge: 2017-06-13 | Disposition: A | Discharge status: Home / Self Care | Payer: Managed Care, Other (non HMO) | Attending: Family Medicine | Admitting: Family Medicine

## 2017-06-13 ENCOUNTER — Other Ambulatory Visit: Payer: Self-pay

## 2017-06-13 ENCOUNTER — Ambulatory Visit (INDEPENDENT_AMBULATORY_CARE_PROVIDER_SITE_OTHER): Payer: Managed Care, Other (non HMO)

## 2017-06-13 DIAGNOSIS — S8392XA Sprain of unspecified site of left knee, initial encounter: Secondary | ICD-10-CM | POA: Diagnosis not present

## 2017-06-13 MED ORDER — MELOXICAM 15 MG PO TABS: 15.0000 mg | ORAL_TABLET | Freq: Every day | ORAL | 0 refills | Status: DC

## 2017-06-13 MED ORDER — HYDROCODONE-ACETAMINOPHEN 5-325 MG PO TABS: ORAL_TABLET | ORAL | 0 refills | Status: AC

## 2017-06-13 NOTE — ED Provider Notes (Signed)
[facility]    CSN: [id number] Arrival date & time: 06/13/17  1706     History   Chief Complaint Chief Complaint  Patient presents with  . Knee Pain    HPI Angela Crawford is a 47 y.o. female.    Knee Pain  Location:  Knee Time since incident:  1 week Lower extremity injury: unknown, but denies falls or other traumatic injury.   Knee location:  L knee Pain details:    Quality:  Aching Chronicity:  New Dislocation: no   Prior injury to area:  No Relieved by:  NSAIDs (minimal) Worsened by:  Activity Associated symptoms: swelling   Associated symptoms: no back pain, no decreased ROM, no fatigue, no fever, no itching, no muscle weakness, no neck pain, no numbness, no stiffness and no tingling   Risk factors: obesity   Risk factors: no frequent fractures and no recent illness     Past Medical History:  Diagnosis Date  . Goiter   . Hypertension     There are no active problems to display for this patient.   Past Surgical History:  Procedure Laterality Date  . ABDOMINAL HYSTERECTOMY      OB History    No data available       Home Medications    Prior to Admission medications   Medication Sig Start Date End Date Taking? Authorizing Provider  levothyroxine (SYNTHROID, LEVOTHROID) 88 MCG tablet Take 88 mcg by mouth daily.     Yes [provider]  Multiple Vitamin (MULTIVITAMIN) tablet Take 1 tablet by mouth daily.     Yes [provider]  olmesartan-hydrochlorothiazide (BENICAR HCT) 40-12.5 MG per tablet Take 1 tablet by mouth daily.     Yes [provider]  albuterol (PROVENTIL HFA;VENTOLIN HFA) 108 (90 BASE) MCG/ACT inhaler Inhale 2 puffs into the lungs every 4 (four) hours as needed for wheezing or shortness of breath. 04/25/14   Hayden Rasmussen, NP  Diclofenac Sodium 3 % GEL Apply topically 4 times a day 09/09/15   Charm Rings, MD  fexofenadine (ALLEGRA) 180 MG tablet Take 1 tablet (180 mg total) by mouth daily. 02/07/13    Linna Hoff, MD  fluticasone (FLONASE) 50 MCG/ACT nasal spray Place 2 sprays into the nose 2 (two) times daily. 02/07/13   Linna Hoff, MD  furosemide (LASIX) 40 MG tablet Take 1 tablet (40 mg total) by mouth every morning. 12/13/12   Graylon Good, PA-C  HYDROcodone-acetaminophen (NORCO/VICODIN) 5-325 MG tablet 1-2 tabs po bid prn 06/13/17   Payton Mccallum, MD  meloxicam (MOBIC) 15 MG tablet Take 1 tablet (15 mg total) by mouth daily. 06/13/17   Payton Mccallum, MD    Family History Family History  Problem Relation Age of Onset  . Hypertension Mother   . Cancer Father     Social History Social History   Tobacco Use  . Smoking status: Never Smoker  . Smokeless tobacco: Never Used  Substance Use Topics  . Alcohol use: Yes  . Drug use: No     Allergies   Patient has no known allergies.   Review of Systems Review of Systems  Constitutional: Negative for fatigue and fever.  Musculoskeletal: Negative for back pain, neck pain and stiffness.  Skin: Negative for itching.     Physical Exam Triage Vital Signs ED Triage Vitals  Enc Vitals Group     BP 06/13/17 1723 (!) 143/92     Pulse Rate 06/13/17 1723 82  Resp 06/13/17 1723 16     Temp 06/13/17 1723 98.4 F (36.9 C)     Temp Source 06/13/17 1723 Oral     SpO2 06/13/17 1723 100 %     Weight 06/13/17 1726 229 lb (103.9 kg)     Height 06/13/17 1726 5\' 5"  (1.651 m)     Head Circumference --      Peak Flow --      Pain Score 06/13/17 1726 7     Pain Loc --      Pain Edu? --      Excl. in [place]? --    No data found.  Updated Vital Signs BP (!) 143/92 (BP Location: Left Arm)   Pulse 82   Temp 98.4 F (36.9 C) (Oral)   Resp 16   Ht 5\' 5"  (1.651 m)   Wt 229 lb (103.9 kg)   SpO2 100%   BMI 38.11 kg/m   Visual Acuity Right Eye Distance:   Left Eye Distance:   Bilateral Distance:    Right Eye Near:   Left Eye Near:    Bilateral Near:     Physical Exam  Constitutional: She appears well-developed and  well-nourished. No distress.  Cardiovascular: Normal rate, regular rhythm and normal heart sounds.  Musculoskeletal:       Left knee: She exhibits swelling. She exhibits no effusion, no ecchymosis, no deformity, no laceration, no erythema, normal alignment, no LCL laxity, normal patellar mobility, no bony tenderness, normal meniscus and no MCL laxity. Tenderness (diffuse) found.  Skin: She is not diaphoretic.  Nursing note and vitals reviewed.    UC Treatments / Results  Labs (all labs ordered are listed, but only abnormal results are displayed) Labs Reviewed - No data to display  EKG  EKG Interpretation None       Radiology Dg Knee Complete 4 Views Left  Result Date: 06/13/2017 CLINICAL DATA:  Pain in left knee for 2 weeks with no injury. Most pain/swelling left lateral side of knee adjacent to left lower patella area. History of bursitis. EXAM: LEFT KNEE - COMPLETE 4+ VIEW COMPARISON:  None. FINDINGS: Osseous alignment is normal. Bone mineralization is normal. No fracture line or displaced fracture fragment seen. No acute or suspicious osseous lesion. No degenerative change seen. Probable small joint effusion within the suprapatellar bursa. Superficial soft tissues are unremarkable. IMPRESSION: 1. No osseous abnormality. 2. Probable small joint effusion within the suprapatellar bursa. Electronically Signed   By: Bary Richard M.D.   On: 06/13/2017 18:27    Procedures Procedures (including critical care time)  Medications Ordered in UC Medications - No data to display   Initial Impression / Assessment and Plan / UC Course  I have reviewed the triage vital signs and the nursing notes.  Pertinent labs & imaging results that were available during my care of the patient were reviewed by me and considered in my medical decision making (see chart for details).       Final Clinical Impressions(s) / UC Diagnoses   Final diagnoses:  Sprain of left knee, unspecified ligament,  initial encounter    ED Discharge Orders        Ordered    meloxicam (MOBIC) 15 MG tablet  Daily     06/13/17 1839    HYDROcodone-acetaminophen (NORCO/VICODIN) 5-325 MG tablet     06/13/17 1839     1. x-ray results and diagnosis reviewed with patient 2. rx as per orders above; reviewed possible side effects, interactions, risks and  benefits  3. Recommend supportive treatment with knee brace, rest, ice, elevation 4. Follow-up prn if symptoms worsen or don't improve  Controlled Substance Prescriptions [place] Controlled Substance Registry consulted? Not Applicable   Payton Mccallumonty, Kairee Isa, MD 06/13/17 [phone]

## 2017-06-13 NOTE — ED Triage Notes (Signed)
Patient started having unexplained left knee pain 1 week ago. No previous surgeries or injuries to the left knee.

## 2017-06-13 NOTE — Discharge Instructions (Signed)
Rest, ice, elevation °

## 2020-11-08 ENCOUNTER — Encounter: Payer: Self-pay | Admitting: Emergency Medicine

## 2020-11-08 ENCOUNTER — Ambulatory Visit (INDEPENDENT_AMBULATORY_CARE_PROVIDER_SITE_OTHER): Payer: 59

## 2020-11-08 ENCOUNTER — Ambulatory Visit
Admission: EM | Admit: 2020-11-08 | Discharge: 2020-11-08 | Disposition: A | Discharge status: Home / Self Care | Payer: 59 | Attending: Sports Medicine | Admitting: Sports Medicine

## 2020-11-08 ENCOUNTER — Other Ambulatory Visit: Payer: Self-pay

## 2020-11-08 DIAGNOSIS — M79674 Pain in right toe(s): Secondary | ICD-10-CM | POA: Diagnosis not present

## 2020-11-08 DIAGNOSIS — M7989 Other specified soft tissue disorders: Secondary | ICD-10-CM

## 2020-11-08 DIAGNOSIS — M79671 Pain in right foot: Secondary | ICD-10-CM

## 2020-11-08 DIAGNOSIS — M7751 Other enthesopathy of right foot: Secondary | ICD-10-CM

## 2020-11-08 MED ORDER — ETODOLAC 500 MG PO TABS: 500.0000 mg | ORAL_TABLET | Freq: Two times a day (BID) | ORAL | 0 refills | Status: AC

## 2020-11-08 NOTE — ED Provider Notes (Signed)
[facility]    CSN: [id number] Arrival date & time: 11/08/20  1055      History   Chief Complaint Chief Complaint  Patient presents with   Foot Pain    right    HPI Angela Crawford is a 50 y.o. female.   Patient is a 50 year old female who presents for evaluation of pain in her right foot for about 5 to 6 days.  She did have some trauma and bumped into her bed.  She is noted some discomfort in her right fourth toe and over the forefoot on the sole of her foot near the MCP joints.  She thinks it swollen.  She says at night it throbs.  She has no history of diabetes.  No numbness or tingling.  No real changes in her activity level.  She has not increased walking or exercise.  She is fairly sedentary.  She does desk work.  She gets her primary care needs met by [facility].  She has no history of psoriasis eczema or rheumatoid arthritis.  No psoriatic arthritis.  She is never seen a podiatrist or a foot specialist in the past.  No other issues or problems are offered.   Past Medical History:  Diagnosis Date   Goiter    Hypertension     There are no problems to display for this patient.   Past Surgical History:  Procedure Laterality Date   ABDOMINAL HYSTERECTOMY      OB History   No obstetric history on file.      Home Medications    Prior to Admission medications   Medication Sig Start Date End Date Taking? Authorizing Provider  etodolac (LODINE) 500 MG tablet Take 1 tablet (500 mg total) by mouth 2 (two) times daily. 11/08/20  Yes Verda Cumins, MD  levothyroxine (SYNTHROID, LEVOTHROID) 88 MCG tablet Take 88 mcg by mouth daily.     Yes [provider]  Multiple Vitamin (MULTIVITAMIN) tablet Take 1 tablet by mouth daily.     Yes [provider]  olmesartan-hydrochlorothiazide (BENICAR HCT) 40-12.5 MG per tablet Take 1 tablet by mouth daily.     Yes [provider]  Semaglutide, 1 MG/DOSE, (OZEMPIC, 1 MG/DOSE,) 4  MG/3ML SOPN See admin instructions.   Yes [provider]  albuterol (PROVENTIL HFA;VENTOLIN HFA) 108 (90 BASE) MCG/ACT inhaler Inhale 2 puffs into the lungs every 4 (four) hours as needed for wheezing or shortness of breath. 04/25/14   Janne Napoleon, NP  Diclofenac Sodium 3 % GEL Apply topically 4 times a day 09/09/15   Melony Overly, MD  fexofenadine (ALLEGRA) 180 MG tablet Take 1 tablet (180 mg total) by mouth daily. 02/07/13   Billy Fischer, MD  fluticasone (FLONASE) 50 MCG/ACT nasal spray Place 2 sprays into the nose 2 (two) times daily. 02/07/13   Billy Fischer, MD  furosemide (LASIX) 40 MG tablet Take 1 tablet (40 mg total) by mouth every morning. 12/13/12   Liam Graham, PA-C  HYDROcodone-acetaminophen (NORCO/VICODIN) 5-325 MG tablet 1-2 tabs po bid prn 06/13/17   Norval Gable, MD    Family History Family History  Problem Relation Age of Onset   Hypertension Mother    Cancer Father     Social History Social History   Tobacco Use   Smoking status: Never   Smokeless tobacco: Never  Vaping Use   Vaping Use: Never used  Substance Use Topics   Alcohol use: Yes   Drug use:  No     Allergies   Patient has no known allergies.   Review of Systems Review of Systems  Constitutional:  Positive for activity change. Negative for appetite change, chills, diaphoresis, fatigue and fever.  HENT:  Negative for congestion, ear pain, postnasal drip, rhinorrhea, sinus pressure, sinus pain, sneezing and sore throat.   Eyes:  Negative for pain.  Respiratory:  Negative for cough, chest tightness and shortness of breath.   Cardiovascular:  Negative for chest pain and palpitations.  Gastrointestinal:  Negative for abdominal pain, diarrhea, nausea and vomiting.  Genitourinary:  Negative for dysuria.  Musculoskeletal:  Positive for arthralgias and joint swelling. Negative for back pain, myalgias and neck pain.  Skin:  Negative for color change, pallor, rash and wound.  Neurological:   Negative for dizziness, light-headedness, numbness and headaches.  All other systems reviewed and are negative.   Physical Exam Triage Vital Signs ED Triage Vitals  Enc Vitals Group     BP 11/08/20 1150 119/87     Pulse Rate 11/08/20 1150 77     Resp 11/08/20 1150 14     Temp 11/08/20 1150 98.4 F (36.9 C)     Temp Source 11/08/20 1150 Oral     SpO2 11/08/20 1150 98 %     Weight 11/08/20 1145 198 lb (89.8 kg)     Height 11/08/20 1145 5' 5.5" (1.664 m)     Head Circumference --      Peak Flow --      Pain Score 11/08/20 1145 7     Pain Loc --      Pain Edu? --      Excl. in [place]? --    No data found.  Updated Vital Signs BP 119/87 (BP Location: Right Arm)   Pulse 77   Temp 98.4 F (36.9 C) (Oral)   Resp 14   Ht 5' 5.5" (1.664 m)   Wt 89.8 kg   SpO2 98%   BMI 32.45 kg/m   Visual Acuity Right Eye Distance:   Left Eye Distance:   Bilateral Distance:    Right Eye Near:   Left Eye Near:    Bilateral Near:     Physical Exam Vitals and nursing note reviewed.  Constitutional:      General: She is not in acute distress.    Appearance: Normal appearance. She is not ill-appearing, toxic-appearing or diaphoretic.  HENT:     Head: Normocephalic and atraumatic.     Nose: Nose normal.     Mouth/Throat:     Mouth: Mucous membranes are moist.  Eyes:     Conjunctiva/sclera: Conjunctivae normal.     Pupils: Pupils are equal, round, and reactive to light.  Cardiovascular:     Rate and Rhythm: Normal rate and regular rhythm.     Pulses: Normal pulses.     Heart sounds: Normal heart sounds. No murmur heard.   No friction rub. No gallop.  Pulmonary:     Effort: Pulmonary effort is normal.     Breath sounds: Normal breath sounds. No stridor. No wheezing, rhonchi or rales.  Musculoskeletal:     Cervical back: Normal range of motion and neck supple.     Comments: Bilateral feet and ankles: She does have pes planus bilaterally.  No tenderness palpation on the left side.  She  is tender on the sole of her foot over the MTP joints.  Are not appreciating swelling.  She has adequate range of motion.  She is tender  over the fourth toe with maybe some minimal swelling.  There is no rash or evidence of psoriatic arthritis.  Exam seems consistent with post trauma and a little bit of tendinitis.  Skin:    General: Skin is warm and dry.     Capillary Refill: Capillary refill takes less than 2 seconds.     Coloration: Skin is not jaundiced.     Findings: No erythema, lesion or rash.  Neurological:     General: No focal deficit present.     Mental Status: She is alert and oriented to person, place, and time.     UC Treatments / Results  Labs (all labs ordered are listed, but only abnormal results are displayed) Labs Reviewed - No data to display  EKG   Radiology DG Foot Complete Right  Result Date: 11/08/2020 CLINICAL DATA:  Pain in the bottom her right foot and pain in her right 4th to due to injury a week ago. EXAM: RIGHT FOOT COMPLETE - 3+ VIEW COMPARISON:  None. FINDINGS: No acute fracture or dislocation. Mild degenerative changes of the first MTP. No area of erosion or osseous destruction. No unexpected radiopaque foreign body. Soft tissues are unremarkable. IMPRESSION: No acute fracture or dislocation. Electronically Signed   By: Valentino Saxon MD   On: 11/08/2020 12:08    Procedures Procedures (including critical care time)  Medications Ordered in UC Medications - No data to display  Initial Impression / Assessment and Plan / UC Course  I have reviewed the triage vital signs and the nursing notes.  Pertinent labs & imaging results that were available during my care of the patient were reviewed by me and considered in my medical decision making (see chart for details).  Clinical impression: 1.  Right foot pain status post trauma with some minimal tenderness palpation. 2.  Bilateral pes planus 3.  Right fourth toe pain 4.  Clinically has  tendinitis.  Treatment plan: 1.  The findings and treatment plan were discussed in detail with the patient.  Patient was in agreement. 2.  Recommended getting an x-ray.  It was ordered and interpreted by myself.  I do not appreciate any acute fracture.  Joint spaces are well-maintained.  There is no foreign body appreciated.  Soft tissues are within normal limits.  I will await radiology over read.  If there is anything that differs from my reading I will be in touch with the patient. 3.  Went ahead and prescribed an anti-inflammatory.  Warned her not to take any Motrin, Advil, ibuprofen, Naprosyn, Aleve or aspirin products.  She can take Tylenol. 4.  Gave her the name and number of a local podiatry group that she can call and make an appointment.  If she improves with the anti-inflammatory and relative rest and icing then she can cancel that appointment. 5.  If symptoms persist that she cannot get into podiatry she should see her PCP. 6.  If they worsen she can come back here or go to the ER. 7.  She was discharged in stable condition and will follow-up here as needed.    Final Clinical Impressions(s) / UC Diagnoses   Final diagnoses:  Foot pain, right  Toe pain, right  Pain and swelling of toe, right  Tendinitis of right foot     Discharge Instructions      As we discussed, your x-rays are within normal limits.  I am awaiting radiology overread.  I will contact you if there is any concerns. I  will prescribe a prescription strength anti-inflammatory and you can get that at your pharmacy. I gave you the name of a podiatry group.  I encourage you to make an appointment and if you are better you can always cancel. Please avoid shoes that may flare this up. Please watch for any rash or any other toe involvement as this may be early psoriatic arthritis.  I do not think you have that but is something to be mindful of. If symptoms persist and you can get into podiatry please see your primary  care provider. If they worsen in any way then please go to the ER.     ED Prescriptions     Medication Sig Dispense Auth. Provider   etodolac (LODINE) 500 MG tablet Take 1 tablet (500 mg total) by mouth 2 (two) times daily. 60 tablet Verda Cumins, MD      PDMP not reviewed this encounter.   Verda Cumins, MD 11/09/20 [phone]

## 2020-11-08 NOTE — Discharge Instructions (Addendum)
As we discussed, your x-rays are within normal limits.  I am awaiting radiology overread.  I will contact you if there is any concerns. I will prescribe a prescription strength anti-inflammatory and you can get that at your pharmacy. I gave you the name of a podiatry group.  I encourage you to make an appointment and if you are better you can always cancel. Please avoid shoes that may flare this up. Please watch for any rash or any other toe involvement as this may be early psoriatic arthritis.  I do not think you have that but is something to be mindful of. If symptoms persist and you can get into podiatry please see your primary care provider. If they worsen in any way then please go to the ER.

## 2020-11-08 NOTE — ED Triage Notes (Signed)
Patient states that she bumped her right foot on the bed a week ago.  Patient reports pain at the bottom of her right foot and in her right 4th toe.

## 2023-06-12 IMAGING — CR DG FOOT COMPLETE 3+V*R*
3 series · 3 of 3 positions shown · non-contrast
Comparison: None.

CLINICAL DATA: Pain in the bottom her right foot and pain in her
right 4th to due to injury a week ago.

EXAM:
RIGHT FOOT COMPLETE - 3+ VIEW

[foot ap]
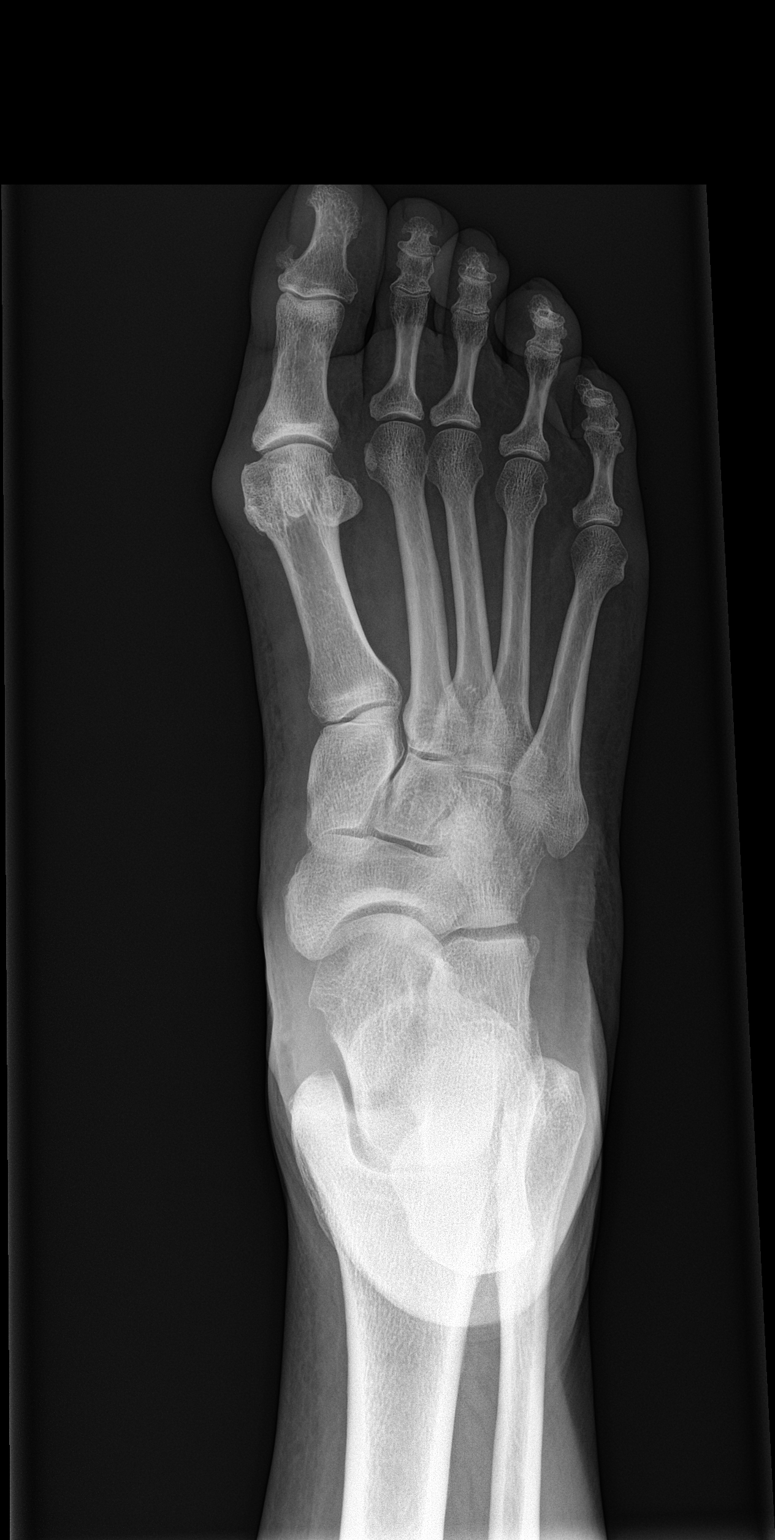

[foot obl]
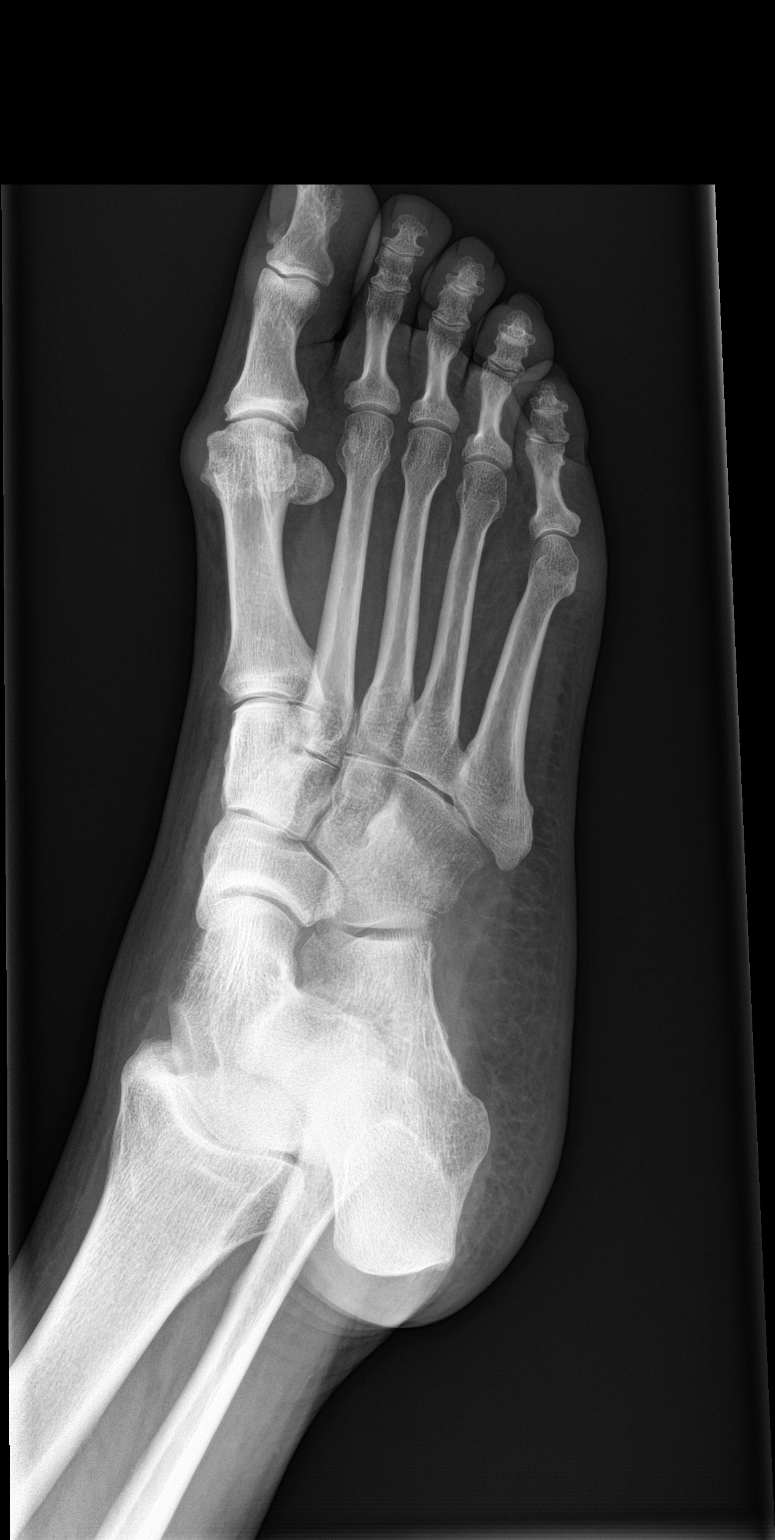

[foot lat]
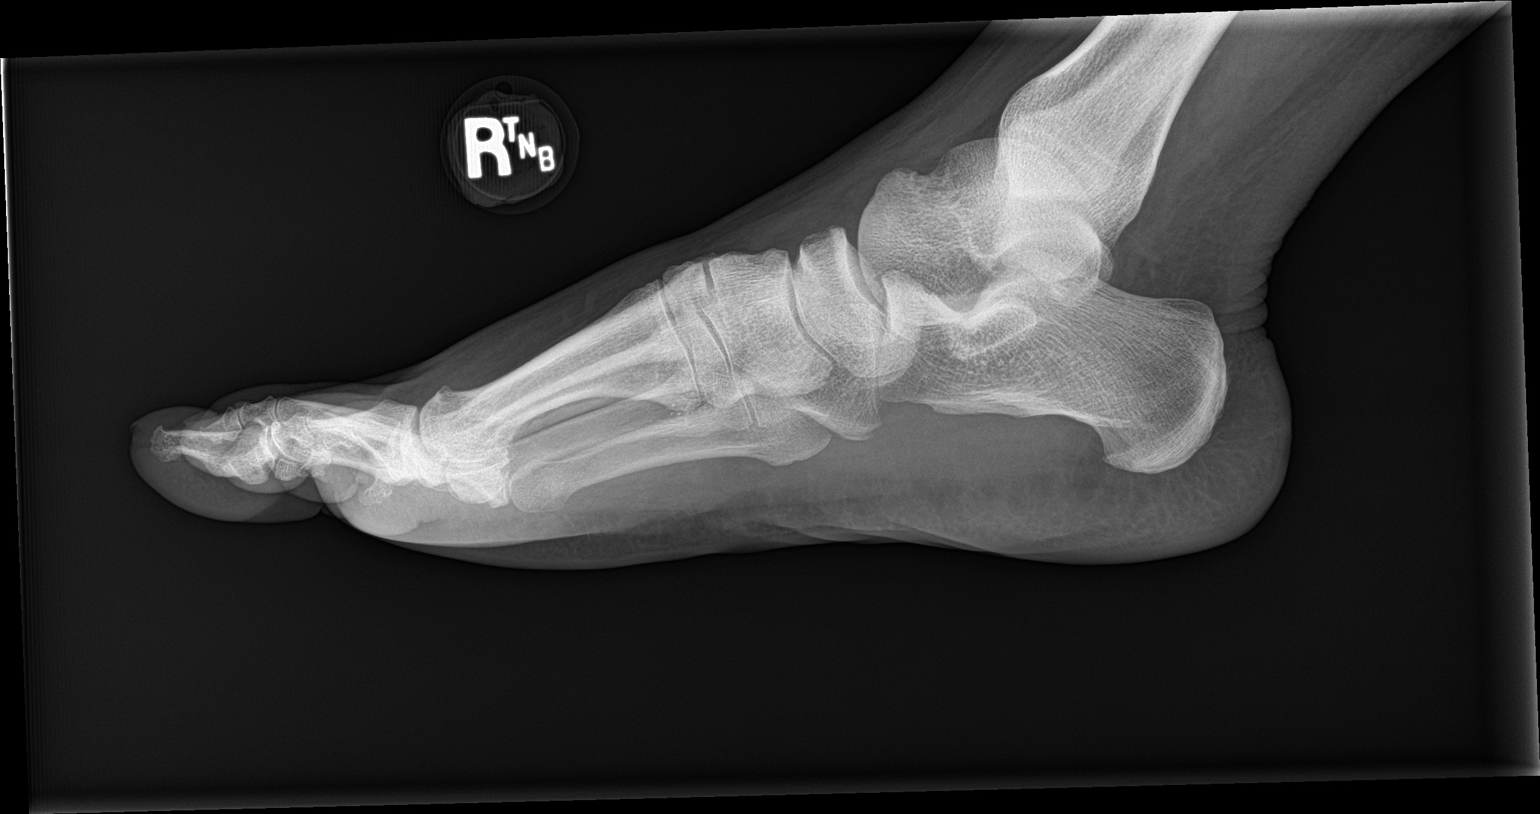

[3 of 3 positions shown; findings below may reference images not displayed]

FINDINGS: No acute fracture or dislocation. Mild degenerative changes of the
first MTP. No area of erosion or osseous destruction. No unexpected
radiopaque foreign body. Soft tissues are unremarkable.
IMPRESSION: No acute fracture or dislocation.
